# Patient Record
Sex: Male | Born: 1965 | Race: White | Hispanic: No | Marital: Single | Smoking: Current every day smoker
Health system: Southern US, Community
[De-identification: ages and names within clinical notes are randomized; demographics above are authoritative.]

## PROBLEM LIST (undated history)

## (undated) DIAGNOSIS — S43006A Unspecified dislocation of unspecified shoulder joint, initial encounter: Secondary | ICD-10-CM

---

## 1998-04-19 ENCOUNTER — Emergency Department (HOSPITAL_COMMUNITY): Admission: EM | Admit: 1998-04-19 | Discharge: 1998-04-19 | Payer: Self-pay | Admitting: Emergency Medicine

## 1998-04-19 ENCOUNTER — Encounter: Payer: Self-pay | Admitting: Emergency Medicine

## 1998-09-14 ENCOUNTER — Emergency Department (HOSPITAL_COMMUNITY): Admission: EM | Admit: 1998-09-14 | Discharge: 1998-09-14 | Payer: Self-pay | Admitting: Emergency Medicine

## 1998-12-29 ENCOUNTER — Encounter: Payer: Self-pay | Admitting: Emergency Medicine

## 1998-12-29 ENCOUNTER — Emergency Department (HOSPITAL_COMMUNITY): Admission: EM | Admit: 1998-12-29 | Discharge: 1998-12-29 | Payer: Self-pay | Admitting: Emergency Medicine

## 1998-12-30 ENCOUNTER — Ambulatory Visit (HOSPITAL_BASED_OUTPATIENT_CLINIC_OR_DEPARTMENT_OTHER): Admission: RE | Admit: 1998-12-30 | Discharge: 1998-12-30 | Payer: Self-pay | Admitting: Orthopedic Surgery

## 2000-09-19 ENCOUNTER — Emergency Department (HOSPITAL_COMMUNITY): Admission: EM | Admit: 2000-09-19 | Discharge: 2000-09-19 | Payer: Self-pay | Admitting: Emergency Medicine

## 2002-06-30 ENCOUNTER — Emergency Department (HOSPITAL_COMMUNITY): Admission: EM | Admit: 2002-06-30 | Discharge: 2002-06-30 | Payer: Self-pay | Admitting: Emergency Medicine

## 2008-12-14 ENCOUNTER — Emergency Department (HOSPITAL_COMMUNITY): Admission: EM | Admit: 2008-12-14 | Discharge: 2008-12-14 | Payer: Self-pay | Admitting: Emergency Medicine

## 2010-07-10 LAB — WOUND CULTURE

## 2010-07-10 LAB — DIFFERENTIAL
Basophils Absolute: 0.1 10*3/uL (ref 0.0–0.1)
Basophils Relative: 1 % (ref 0–1)
Eosinophils Absolute: 0.1 10*3/uL (ref 0.0–0.7)
Eosinophils Relative: 2 % (ref 0–5)
Lymphocytes Relative: 27 % (ref 12–46)
Lymphs Abs: 2.4 10*3/uL (ref 0.7–4.0)
Monocytes Absolute: 0.7 10*3/uL (ref 0.1–1.0)
Monocytes Relative: 8 % (ref 3–12)
Neutro Abs: 5.6 10*3/uL (ref 1.7–7.7)
Neutrophils Relative %: 63 % (ref 43–77)

## 2010-07-10 LAB — CBC
HCT: 45.5 % (ref 39.0–52.0)
Hemoglobin: 15.7 g/dL (ref 13.0–17.0)
MCHC: 34.5 g/dL (ref 30.0–36.0)
MCV: 87.6 fL (ref 78.0–100.0)
Platelets: 231 10*3/uL (ref 150–400)
RBC: 5.19 MIL/uL (ref 4.22–5.81)
RDW: 14.1 % (ref 11.5–15.5)
WBC: 8.9 10*3/uL (ref 4.0–10.5)

## 2010-11-08 ENCOUNTER — Emergency Department (HOSPITAL_COMMUNITY)
Admission: EM | Admit: 2010-11-08 | Discharge: 2010-11-08 | Disposition: A | Discharge status: Home / Self Care | Payer: Self-pay | Attending: Emergency Medicine | Admitting: Emergency Medicine

## 2010-11-08 DIAGNOSIS — H9209 Otalgia, unspecified ear: Secondary | ICD-10-CM | POA: Insufficient documentation

## 2010-11-08 DIAGNOSIS — H60399 Other infective otitis externa, unspecified ear: Secondary | ICD-10-CM | POA: Insufficient documentation

## 2012-11-18 ENCOUNTER — Emergency Department (HOSPITAL_COMMUNITY)
Admission: EM | Admit: 2012-11-18 | Discharge: 2012-11-18 | Disposition: A | Discharge status: Home / Self Care | Payer: Medicaid Other | Attending: Emergency Medicine | Admitting: Emergency Medicine

## 2012-11-18 ENCOUNTER — Encounter (HOSPITAL_COMMUNITY): Payer: Self-pay | Admitting: *Deleted

## 2012-11-18 ENCOUNTER — Emergency Department (HOSPITAL_COMMUNITY): Payer: Medicaid Other

## 2012-11-18 DIAGNOSIS — R1011 Right upper quadrant pain: Secondary | ICD-10-CM

## 2012-11-18 DIAGNOSIS — F17200 Nicotine dependence, unspecified, uncomplicated: Secondary | ICD-10-CM | POA: Insufficient documentation

## 2012-11-18 LAB — COMPREHENSIVE METABOLIC PANEL
BUN: 15 mg/dL (ref 6–23)
CO2: 24 mEq/L (ref 19–32)
Calcium: 9.3 mg/dL (ref 8.4–10.5)
Chloride: 100 mEq/L (ref 96–112)
Creatinine, Ser: 1.07 mg/dL (ref 0.50–1.35)
GFR calc non Af Amer: 82 mL/min — ABNORMAL LOW (ref >90)
Total Bilirubin: 0.2 mg/dL — ABNORMAL LOW (ref 0.3–1.2)

## 2012-11-18 LAB — POCT I-STAT TROPONIN I
Troponin i, poc: 0 ng/mL (ref 0.00–0.08)
Troponin i, poc: 0 ng/mL (ref 0.00–0.08)

## 2012-11-18 LAB — CBC WITH DIFFERENTIAL/PLATELET
Basophils Absolute: 0.1 10*3/uL (ref 0.0–0.1)
Lymphocytes Relative: 26 % (ref 12–46)
Neutro Abs: 7.1 10*3/uL (ref 1.7–7.7)
Neutrophils Relative %: 64 % (ref 43–77)
Platelets: 239 10*3/uL (ref 150–400)
RDW: 13.7 % (ref 11.5–15.5)
WBC: 11.1 10*3/uL — ABNORMAL HIGH (ref 4.0–10.5)

## 2012-11-18 LAB — LIPASE, BLOOD: Lipase: 45 U/L (ref 11–59)

## 2012-11-18 MED ORDER — OMEPRAZOLE 20 MG PO CPDR: 20.0000 mg | DELAYED_RELEASE_CAPSULE | Freq: Every day | ORAL | Status: DC

## 2012-11-18 MED ORDER — MORPHINE SULFATE 4 MG/ML IJ SOLN
4.0000 mg | Freq: Once | INTRAMUSCULAR | Status: AC
  Administered 2012-11-18: 4 mg via INTRAVENOUS
  Filled 2012-11-18: qty 1

## 2012-11-18 MED ORDER — ONDANSETRON HCL 4 MG/2ML IJ SOLN
4.0000 mg | Freq: Once | INTRAMUSCULAR | Status: AC
Start: 2012-11-18 — End: 2012-11-18
  Administered 2012-11-18: 4 mg via INTRAVENOUS
  Filled 2012-11-18: qty 2

## 2012-11-18 MED ORDER — OXYCODONE-ACETAMINOPHEN 5-325 MG PO TABS: 1.0000 | ORAL_TABLET | ORAL | Status: DC | PRN

## 2012-11-18 MED ORDER — SODIUM CHLORIDE 0.9 % IV BOLUS (SEPSIS)
1000.0000 mL | Freq: Once | INTRAVENOUS | Status: AC
  Administered 2012-11-18: 1000 mL via INTRAVENOUS

## 2012-11-18 NOTE — ED Notes (Signed)
Pt reports onset of RUQ pain last night, increases with movement, palpation, breathing. Denies any n/v/d. No acute distress noted at triage.

## 2012-11-18 NOTE — ED Provider Notes (Signed)
CSN: [id number]     Arrival date & time 11/18/12  1704 History     First MD Initiated Contact with Patient 11/18/12 1721     Chief Complaint  Patient presents with  . Abdominal Pain   (Consider location/radiation/quality/duration/timing/severity/associated sxs/prior Treatment) HPI Comments: Patient is a 47 year old male past medical history significant for tobacco use to emergency department for acute onset of right quadrant pain began last evening after consuming "several beers." Patient describes his pain as constant and sharp the radiation is worsened with deep inspiration and palpation. He rates his pain 10/10 without alleviating factors. Patient denies any chest pain, shortness of breath fevers, chills, nausea, vomiting, diarrhea. Patient has no prior abdominal surgical history.  Patient is a 47 y.o. male presenting with abdominal pain.  Abdominal Pain Associated symptoms: no chest pain, no chills, no constipation, no diarrhea, no fever, no nausea and no vomiting     History reviewed. No pertinent past medical history. History reviewed. No pertinent past surgical history. History reviewed. No pertinent family history. History  Substance Use Topics  . Smoking status: Current Every Day Smoker    Types: Cigarettes  . Smokeless tobacco: Not on file  . Alcohol Use: Yes     Comment: beer occ    Review of Systems  Constitutional: Negative for fever and chills.  HENT: Negative.   Eyes: Negative.   Respiratory: Negative.   Cardiovascular: Negative for chest pain.  Gastrointestinal: Positive for abdominal pain. Negative for nausea, vomiting, diarrhea and constipation.  Genitourinary: Negative.   Musculoskeletal: Negative.   Skin: Negative.   Neurological: Negative.     Allergies  Review of patient's allergies indicates no known allergies.  Home Medications   Current Outpatient Rx  Name  Route  Sig  Dispense  Refill  . ibuprofen (ADVIL,MOTRIN) 800 MG tablet   Oral   Take  800 mg by mouth every 8 (eight) hours as needed for pain.         Marland Kitchen omeprazole (PRILOSEC) 20 MG capsule   Oral   Take 1 capsule (20 mg total) by mouth daily.   30 capsule   0   . oxyCODONE-acetaminophen (PERCOCET/ROXICET) 5-325 MG per tablet   Oral   Take 1 tablet by mouth every 4 (four) hours as needed for pain.   15 tablet   0    BP 103/60  Pulse 72  Temp(Src) 98.3 F (36.8 C) (Oral)  Resp 19  SpO2 98% Physical Exam  Constitutional: He is oriented to person, place, and time. He appears well-developed and well-nourished. No distress.  HENT:  Head: Normocephalic and atraumatic.  Mouth/Throat: Oropharynx is clear and moist.  Eyes: Conjunctivae are normal.  Neck: Neck supple.  Cardiovascular: Normal rate, regular rhythm, normal heart sounds and intact distal pulses.   Pulmonary/Chest: Effort normal and breath sounds normal.  Abdominal: Soft. Bowel sounds are normal. There is tenderness in the right upper quadrant. There is positive Murphy's sign. There is no rigidity, no rebound and no guarding.  Neurological: He is alert and oriented to person, place, and time.  Skin: Skin is warm and dry. He is not diaphoretic.    ED Course   Procedures (including critical care time)   Date: 11/18/2012  Rate: 68  Rhythm: normal sinus rhythm  QRS Axis: normal  Intervals: normal  ST/T Wave abnormalities: normal  Conduction Disutrbances:none  Narrative Interpretation:   Old EKG Reviewed: none available   Labs Reviewed  COMPREHENSIVE METABOLIC PANEL - Abnormal; Notable for the  following:    Glucose, Bld 101 (*)    Total Bilirubin 0.2 (*)    GFR calc non Af Amer 82 (*)    All other components within normal limits  CBC WITH DIFFERENTIAL - Abnormal; Notable for the following:    WBC 11.1 (*)    All other components within normal limits  LIPASE, BLOOD  URINALYSIS, ROUTINE W REFLEX MICROSCOPIC  POCT I-STAT TROPONIN I  POCT I-STAT TROPONIN I   US Abdomen Complete  11/18/2012    *RADIOLOGY REPORT*  Abdominal ultrasound  History:  Abdominal pain  Comparison:  None  Findings:  Gallbladder is visualized in multiple projections.  The gallbladder wall is thickened.  There is no pericholecystic fluid. There are several opacities along the wall of gallbladder which do not move but do show ring-down type artifact.  This appearance is consistent with inflammatory change in the gallbladder wall such as cholesterolosis or adenomyomatosis.  There are no echogenic foci which move and shadow as is expected with gallstones.  There is no intrahepatic, common hepatic, or common bile duct dilatation.  Pancreas appears normal.  The liver has a a somewhat inhomogeneous echotexture pattern with overall increased echogenicity.  No focal liver lesions are identified.  Spleen is normal in size and homogeneous in echotexture. Kidneys bilaterally appear normal.  There is no ascites.  Aorta is nonaneurysmal.  Inferior vena cava appears normal.  Conclusion:  The appearance of the gallbladder suggests inflammatory process such as cholesterolosis or adenomyomatosis. No gallstones are seen, and there is no pericholecystic fluid. There is no biliary duct dilatation.  The liver has an inhomogeneous echotexture pattern.  The appearance of the liver is consistent with fatty change, possibly with superimposed parenchymal disease.  While no liver masses are identified, it must be cautioned that the sensitivity of ultrasound for focal mass lesions is diminished given this underlying echotexture pattern.  Study otherwise unremarkable.   Original Report Authenticated By: Bretta Bang, M.D.   1. RUQ abdominal pain     MDM  Patient presenting with right upper quadrant abdominal pain. Abdomen soft, nondistended no guarding, rigidity, distention. Murphy sign appreciated. Labs, EKG, and imaging have been reviewed. No concerning for acute cardiac cause of pain. Ultrasound showed no acute findings warranting hospitalization or  surgery consult this time. Pain managed in the ED. On reevaluation no acute abdomen. Advised patient to follow up with general surgeon as an outpatient. Also advised patient to be started on omeprazole. Patient is agreeable to plan. Discussed patient case with Dr. Fayrene Fearing who agrees with my plan. Patient stable at time of discharge.  Jeannetta Ellis, PA-C 11/18/12 2046

## 2012-11-21 NOTE — ED Provider Notes (Signed)
Medical screening examination/treatment/procedure(s) were performed by non-physician practitioner and as supervising physician I was immediately available for consultation/collaboration.   Shirle Provencal Joseph Hildegarde Dunaway, MD 11/21/12 0716 

## 2012-12-01 ENCOUNTER — Ambulatory Visit (INDEPENDENT_AMBULATORY_CARE_PROVIDER_SITE_OTHER): Payer: Medicaid Other | Admitting: Surgery

## 2012-12-01 ENCOUNTER — Encounter (INDEPENDENT_AMBULATORY_CARE_PROVIDER_SITE_OTHER): Payer: Self-pay | Admitting: Surgery

## 2012-12-01 VITALS — BP 132/80 | HR 72 | Temp 98.6°F | Resp 15 | Ht 68.0 in | Wt 211.6 lb

## 2012-12-01 DIAGNOSIS — R1013 Epigastric pain: Secondary | ICD-10-CM

## 2012-12-01 NOTE — Progress Notes (Signed)
Patient ID: Anthony Lewis, male   DOB: 1965/07/12, 47 y.o.   MRN: [id number]  Chief Complaint  Patient presents with  . New Evaluation    eval gb    HPI Anthony Lewis is a 47 y.o. male.  Patient sent at the request of Dr. Fayrene Fearing for history of epigastric and right upper quadrant pain. He was traveling back from [place] 3 weeks ago and developed pain in his epigastrium and along his right costal margin. He was seen in the emergency room an ultrasound showed no gallstones the gallbladder wall was felt to be thickened. He was sent home and seen by his primary care doctor. She's placed on omeprazole and this made his pain go away. He has had no problems since. Denies any feeding intolerance. He is able eat fatty food without any pain. He feels fine now. HPI  History reviewed. No pertinent past medical history.  History reviewed. No pertinent past surgical history.  History reviewed. No pertinent family history.  Social History History  Substance Use Topics  . Smoking status: Current Every Day Smoker -- 1.50 packs/day    Types: Cigarettes  . Smokeless tobacco: Never Used  . Alcohol Use: Yes     Comment: beer occ    No Known Allergies  Current Outpatient Prescriptions  Medication Sig Dispense Refill  . ibuprofen (ADVIL,MOTRIN) 800 MG tablet Take 800 mg by mouth every 8 (eight) hours as needed for pain.      Marland Kitchen omeprazole (PRILOSEC) 20 MG capsule Take 1 capsule (20 mg total) by mouth daily.  30 capsule  0  . oxyCODONE-acetaminophen (PERCOCET/ROXICET) 5-325 MG per tablet Take 1 tablet by mouth every 4 (four) hours as needed for pain.  15 tablet  0   No current facility-administered medications for this visit.    Review of Systems Review of Systems  Constitutional: Negative.   HENT: Negative.   Eyes: Negative.   Respiratory: Positive for cough.   Cardiovascular: Negative.   Gastrointestinal: Negative.   Endocrine: Negative.   Genitourinary: Negative.    Musculoskeletal: Negative.   Skin: Negative.   Allergic/Immunologic: Negative.   Neurological: Negative.   Hematological: Negative.   Psychiatric/Behavioral: Negative.     Blood pressure 132/80, pulse 72, temperature 98.6 F (37 C), temperature source Temporal, resp. rate 15, height 5\' 8"  (1.727 m), weight 211 lb 9.6 oz (95.981 kg).  Physical Exam Physical Exam  Constitutional: He is oriented to person, place, and time. He appears well-developed and well-nourished.  HENT:  Head: Normocephalic and atraumatic.  Eyes: EOM are normal. Pupils are equal, round, and reactive to light.  Neck: Normal range of motion. Neck supple.  Cardiovascular: Normal rate and regular rhythm.   Pulmonary/Chest: He has wheezes.  Abdominal: Soft. Bowel sounds are normal. He exhibits no distension. There is no tenderness. There is no rebound and no guarding.  Musculoskeletal: Normal range of motion.  Neurological: He is alert and oriented to person, place, and time.  Skin: Skin is warm and dry.  Psychiatric: He has a normal mood and affect. His behavior is normal. Judgment and thought content normal.    Data Reviewed Abdominal ultrasound  History: Abdominal pain  Comparison: None  Findings: Gallbladder is visualized in multiple projections. The  gallbladder wall is thickened. There is no pericholecystic fluid.  There are several opacities along the wall of gallbladder which do  not move but do show ring-down type artifact. This appearance is  consistent with inflammatory change in the gallbladder  wall such as  cholesterolosis or adenomyomatosis. There are no echogenic foci  which move and shadow as is expected with gallstones.  There is no intrahepatic, common hepatic, or common bile duct  dilatation. Pancreas appears normal.  The liver has a a somewhat inhomogeneous echotexture pattern with  overall increased echogenicity. No focal liver lesions are  identified.  Spleen is normal in size and  homogeneous in echotexture. Kidneys  bilaterally appear normal. There is no ascites. Aorta is  nonaneurysmal. Inferior vena cava appears normal.  Conclusion: The appearance of the gallbladder suggests  inflammatory process such as cholesterolosis or adenomyomatosis. No  gallstones are seen, and there is no pericholecystic fluid. There  is no biliary duct dilatation.  The liver has an inhomogeneous echotexture pattern. The appearance  of the liver is consistent with fatty change, possibly with  superimposed parenchymal disease. While no liver masses are  identified, it must be cautioned that the sensitivity of ultrasound  for focal mass lesions is diminished given this underlying  echotexture pattern.  Study otherwise unremarkable.  Original Report Authenticated By: Bretta Bang, M.D.         Assessment    Abdominal pain resolved with omeprazole  Thickened gallbladder wall without stones    Plan    Continue omeprazole. No indication cholecystectomy necessary. If symptoms worsen or become related to eating may need HIDA  Scan.  Followup as needed.       [name] 12/01/2012, 10:27 AM

## 2012-12-01 NOTE — Patient Instructions (Signed)
Return if symptoms worsen

## 2014-08-21 ENCOUNTER — Emergency Department (HOSPITAL_COMMUNITY)
Admission: EM | Admit: 2014-08-21 | Discharge: 2014-08-22 | Disposition: A | Discharge status: Home / Self Care | Payer: Self-pay | Attending: Emergency Medicine | Admitting: Emergency Medicine

## 2014-08-21 ENCOUNTER — Encounter (HOSPITAL_COMMUNITY): Payer: Self-pay | Admitting: Emergency Medicine

## 2014-08-21 ENCOUNTER — Emergency Department (HOSPITAL_COMMUNITY): Payer: Medicaid Other

## 2014-08-21 ENCOUNTER — Emergency Department (HOSPITAL_COMMUNITY): Payer: Self-pay

## 2014-08-21 DIAGNOSIS — W19XXXA Unspecified fall, initial encounter: Secondary | ICD-10-CM

## 2014-08-21 DIAGNOSIS — R52 Pain, unspecified: Secondary | ICD-10-CM

## 2014-08-21 DIAGNOSIS — Y939 Activity, unspecified: Secondary | ICD-10-CM | POA: Insufficient documentation

## 2014-08-21 DIAGNOSIS — Y999 Unspecified external cause status: Secondary | ICD-10-CM | POA: Insufficient documentation

## 2014-08-21 DIAGNOSIS — S43005A Unspecified dislocation of left shoulder joint, initial encounter: Secondary | ICD-10-CM | POA: Insufficient documentation

## 2014-08-21 DIAGNOSIS — W1789XA Other fall from one level to another, initial encounter: Secondary | ICD-10-CM | POA: Insufficient documentation

## 2014-08-21 DIAGNOSIS — S40812A Abrasion of left upper arm, initial encounter: Secondary | ICD-10-CM | POA: Insufficient documentation

## 2014-08-21 DIAGNOSIS — M21822 Other specified acquired deformities of left upper arm: Secondary | ICD-10-CM

## 2014-08-21 DIAGNOSIS — Z720 Tobacco use: Secondary | ICD-10-CM | POA: Insufficient documentation

## 2014-08-21 DIAGNOSIS — Y9289 Other specified places as the place of occurrence of the external cause: Secondary | ICD-10-CM | POA: Insufficient documentation

## 2014-08-21 DIAGNOSIS — S42292A Other displaced fracture of upper end of left humerus, initial encounter for closed fracture: Secondary | ICD-10-CM | POA: Insufficient documentation

## 2014-08-21 MED ORDER — HYDROMORPHONE HCL 1 MG/ML IJ SOLN
1.0000 mg | Freq: Once | INTRAMUSCULAR | Status: AC
  Administered 2014-08-21: 1 mg via INTRAVENOUS
  Filled 2014-08-21: qty 1

## 2014-08-21 MED ORDER — OXYCODONE-ACETAMINOPHEN 5-325 MG PO TABS: 1.0000 | ORAL_TABLET | ORAL | Status: DC | PRN

## 2014-08-21 MED ORDER — KETAMINE HCL 10 MG/ML IJ SOLN
0.5000 mg/kg | Freq: Once | INTRAMUSCULAR | Status: DC
  Filled 2014-08-21: qty 4.8

## 2014-08-21 MED ORDER — PROPOFOL 10 MG/ML IV BOLUS
INTRAVENOUS | Status: AC | PRN
  Administered 2014-08-21: 12.5 mg via INTRAVENOUS
  Administered 2014-08-21: 25 mg via INTRAVENOUS
  Administered 2014-08-21: 12.5 mg via INTRAVENOUS

## 2014-08-21 MED ORDER — KETAMINE HCL 10 MG/ML IJ SOLN: 0.5000 mg/kg | Freq: Once | INTRAMUSCULAR | Status: DC

## 2014-08-21 MED ORDER — KETAMINE HCL 10 MG/ML IJ SOLN
INTRAMUSCULAR | Status: AC | PRN
  Administered 2014-08-21: 12.5 mg via INTRAVENOUS
  Administered 2014-08-21: 25 mg via INTRAVENOUS
  Administered 2014-08-21: 12.5 mg via INTRAVENOUS

## 2014-08-21 MED ORDER — ONDANSETRON HCL 4 MG/2ML IJ SOLN
4.0000 mg | Freq: Once | INTRAMUSCULAR | Status: AC
Start: 2014-08-21 — End: 2014-08-21
  Administered 2014-08-21: 4 mg via INTRAVENOUS
  Filled 2014-08-21: qty 2

## 2014-08-21 MED ORDER — PROPOFOL 10 MG/ML IV BOLUS
0.5000 mg/kg | Freq: Once | INTRAVENOUS | Status: DC
  Filled 2014-08-21: qty 20

## 2014-08-21 NOTE — Sedation Documentation (Signed)
Ambu bag, airway cart, and code cart at bedside. O2 and 12 lead placed on patient. VSS. Consent obtained. EDP at bedside.

## 2014-08-21 NOTE — Discharge Instructions (Signed)
Shoulder Dislocation Your shoulder is made up of three bones: the collar bone (clavicle); the shoulder blade (scapula), which includes the socket (glenoid cavity); and the upper arm bone (humerus). Your shoulder joint is the place where these bones meet. Strong, fibrous tissues hold these bones together (ligaments). Muscles and strong, fibrous tissues that connect the muscles to these bones (tendons) allow your arm to move through this joint. The range of motion of your shoulder joint is more extensive than most of your other joints, and the glenoid cavity is very shallow. That is the reason that your shoulder joint is one of the most unstable joints in your body. It is far more prone to dislocation than your other joints. Shoulder dislocation is when your humerus is forced out of your shoulder joint. CAUSES Shoulder dislocation is caused by a forceful impact on your shoulder. This impact usually is from an injury, such as a sports injury or a fall. SYMPTOMS Symptoms of shoulder dislocation include:  Deformity of your shoulder.  Intense pain.  Inability to move your shoulder joint.  Numbness, weakness, or tingling around your shoulder joint (your neck or down your arm).  Bruising or swelling around your shoulder. DIAGNOSIS In order to diagnose a dislocated shoulder, your caregiver will perform a physical exam. Your caregiver also may have an X-ray exam done to see if you have any broken bones. Magnetic resonance imaging (MRI) is a procedure that sometimes is done to help your caregiver see any damage to the soft tissues around your shoulder, particularly your rotator cuff tendons. Additionally, your caregiver also may have electromyography done to measure the electrical discharges produced in your muscles if you have signs or symptoms of nerve damage. TREATMENT A shoulder dislocation is treated by placing the humerus back in the joint (reduction). Your caregiver does this either manually (closed  reduction), by moving your humerus back into the joint through manipulation, or through surgery (open reduction). When your humerus is back in place, severe pain should improve almost immediately. You also may need to have surgery if you have a weak shoulder joint or ligaments, and you have recurring shoulder dislocations, despite rehabilitation. In rare cases, surgery is necessary if your nerves or blood vessels are damaged during the dislocation. After your reduction, your arm will be placed in a shoulder immobilizer or sling to keep it from moving. Your caregiver will have you wear your shoulder immobilizer or sling for 3 days to 3 weeks, depending on how serious your dislocation is. When your shoulder immobilizer or sling is removed, your caregiver may prescribe physical therapy to help improve the range of motion in your shoulder joint. HOME CARE INSTRUCTIONS  The following measures can help to reduce pain and speed up the healing process:  Rest your injured joint. Do not move it. Avoid activities similar to the one that caused your injury.  Apply ice to your injured joint for the first day or two after your reduction or as directed by your caregiver. Applying ice helps to reduce inflammation and pain.  Put ice in a plastic bag.  Place a towel between your skin and the bag.  Leave the ice on for 15-20 minutes at a time, every 2 hours while you are awake.  Exercise your hand by squeezing a soft ball. This helps to eliminate stiffness and swelling in your hand and wrist.  Take over-the-counter or prescription medicine for pain or discomfort as told by your caregiver. SEEK IMMEDIATE MEDICAL CARE IF:   Your  shoulder immobilizer or sling becomes damaged.  Your pain becomes worse rather than better.  You lose feeling in your arm or hand, or they become white and cold. MAKE SURE YOU:   Understand these instructions.  Will watch your condition.  Will get help right away if you are not  doing well or get worse. Document Released: 12/15/2000 Document Revised: 08/06/2013 Document Reviewed: 01/10/2011 [facility] Patient Information 2015 [place], [place]. This information is not intended to replace advice given to you by your health care provider. Make sure you discuss any questions you have with your health care provider.  Abrasion An abrasion is a cut or scrape of the skin. Abrasions do not extend through all layers of the skin and most heal within 10 days. It is important to care for your abrasion properly to prevent infection. CAUSES  Most abrasions are caused by falling on, or gliding across, the ground or other surface. When your skin rubs on something, the outer and inner layer of skin rubs off, causing an abrasion. DIAGNOSIS  Your caregiver will be able to diagnose an abrasion during a physical exam.  TREATMENT  Your treatment depends on how large and deep the abrasion is. Generally, your abrasion will be cleaned with water and a mild soap to remove any dirt or debris. An antibiotic ointment may be put over the abrasion to prevent an infection. A bandage (dressing) may be wrapped around the abrasion to keep it from getting dirty.  You may need a tetanus shot if:  You cannot remember when you had your last tetanus shot.  You have never had a tetanus shot.  The injury broke your skin. If you get a tetanus shot, your arm may swell, get red, and feel warm to the touch. This is common and not a problem. If you need a tetanus shot and you choose not to have one, there is a rare chance of getting tetanus. Sickness from tetanus can be serious.  HOME CARE INSTRUCTIONS   If a dressing was applied, change it at least once a day or as directed by your caregiver. If the bandage sticks, soak it off with warm water.   Wash the area with water and a mild soap to remove all the ointment 2 times a day. Rinse off the soap and pat the area dry with a clean towel.   Reapply any ointment as  directed by your caregiver. This will help prevent infection and keep the bandage from sticking. Use gauze over the wound and under the dressing to help keep the bandage from sticking.   Change your dressing right away if it becomes wet or dirty.   Only take over-the-counter or prescription medicines for pain, discomfort, or fever as directed by your caregiver.   Follow up with your caregiver within 24-48 hours for a wound check, or as directed. If you were not given a wound-check appointment, look closely at your abrasion for redness, swelling, or pus. These are signs of infection. SEEK IMMEDIATE MEDICAL CARE IF:   You have increasing pain in the wound.   You have redness, swelling, or tenderness around the wound.   You have pus coming from the wound.   You have a fever or persistent symptoms for more than 2-3 days.  You have a fever and your symptoms suddenly get worse.  You have a bad smell coming from the wound or dressing.  MAKE SURE YOU:   Understand these instructions.  Will watch your condition.  Will  get help right away if you are not doing well or get worse. Document Released: 12/30/2004 Document Revised: 03/08/2012 Document Reviewed: 02/23/2011 [facility]Exit[facility] Patient Information 2015 [place], [place]. This information is not intended to replace advice given to you by your health care provider. Make sure you discuss any questions you have with your health care provider.

## 2014-08-21 NOTE — ED Notes (Signed)
Pt. fell from a tree ( approx. 15 ft. high ) and landed on his left side , no LOC / ambulatory , reports pain at left shoulder with deformity , left arm pain and left knee pain  .

## 2014-08-21 NOTE — ED Notes (Signed)
Pt remains monitored by blood pressure and pulse ox. Pts family remains at bedside.  

## 2014-08-21 NOTE — ED Notes (Signed)
Pharmacist removed left over ketamine in both vial and syringe.

## 2014-08-21 NOTE — ED Provider Notes (Signed)
CSN: [id number]     Arrival date & time 08/21/14  2026 History   First MD Initiated Contact with Patient 08/21/14 2144     Chief Complaint  Patient presents with  . Fall  . Shoulder Injury     (Consider location/radiation/quality/duration/timing/severity/associated sxs/prior Treatment) HPI    Raynald KempMichael C Kainz is a 49 y.o. male who fell down a 10 foot incline, injuring his left shoulder. He denies other injuries. He has not hurt this shoulder previously. He denies headache, neck pain, back pain, weakness or dizziness. He came here by private vehicle. There are no other known modifying factors.    History reviewed. No pertinent past medical history. History reviewed. No pertinent past surgical history. No family history on file. History  Substance Use Topics  . Smoking status: Current Every Day Smoker -- 1.50 packs/day    Types: Cigarettes  . Smokeless tobacco: Never Used  . Alcohol Use: Yes     Comment: beer occ    Review of Systems  All other systems reviewed and are negative.     Allergies  Review of patient's allergies indicates no known allergies.  Home Medications   Prior to Admission medications   Medication Sig Start Date End Date Taking? Authorizing Provider  ibuprofen (ADVIL,MOTRIN) 800 MG tablet Take 800 mg by mouth every 8 (eight) hours as needed for pain.   Yes Historical Provider, MD  omeprazole (PRILOSEC) 20 MG capsule Take 1 capsule (20 mg total) by mouth daily. Patient not taking: Reported on 08/21/2014 11/18/12   Francee PiccoloJennifer Piepenbrink, PA-C  oxyCODONE-acetaminophen (PERCOCET/ROXICET) 5-325 MG per tablet Take 1 tablet by mouth every 4 (four) hours as needed. 08/21/14   Mancel BaleElliott Abdifatah Colquhoun, MD   BP 124/85 mmHg  Pulse 83  Temp(Src) 98.7 F (37.1 C) (Oral)  Resp 20  Ht 5' 8.11" (1.73 m)  Wt 212 lb (96.163 kg)  BMI 32.13 kg/m2  SpO2 97% Physical Exam  Constitutional: He is oriented to person, place, and time. He appears well-developed and well-nourished.   HENT:  Head: Normocephalic and atraumatic.  Right Ear: External ear normal.  Left Ear: External ear normal.  Eyes: Conjunctivae and EOM are normal. Pupils are equal, round, and reactive to light.  Neck: Normal range of motion and phonation normal. Neck supple.  Cardiovascular: Normal rate, regular rhythm and normal heart sounds.   Pulmonary/Chest: Effort normal and breath sounds normal. He exhibits no bony tenderness.  Abdominal: Soft. There is no tenderness.  Musculoskeletal: Normal range of motion.  Left shoulder subacromial defect consistent with shoulder dislocation. Intact deltoid sensation and intact distal circulation and sensory in left hand  Neurological: He is alert and oriented to person, place, and time. No cranial nerve deficit or sensory deficit. He exhibits normal muscle tone. Coordination normal.  Skin: Skin is warm, dry and intact.  Psychiatric: He has a normal mood and affect. His behavior is normal. Judgment and thought content normal.  Nursing note and vitals reviewed.   ED Course  Procedures (including critical care time)  Medications  propofol (DIPRIVAN) 10 mg/mL bolus/IV push 48.1 mg (not administered)  ketamine (KETALAR) injection 48 mg (not administered)  ondansetron (ZOFRAN) injection 4 mg (4 mg Intravenous Given 08/21/14 2122)  HYDROmorphone (DILAUDID) injection 1 mg (1 mg Intravenous Given 08/21/14 2122)  propofol (DIPRIVAN) 10 mg/mL bolus/IV push ( Intravenous Stopped 08/21/14 2212)  ketamine (KETALAR) injection (12.5 mg Intravenous Given 08/21/14 2211)   Procedural sedation Performed by: Flint MelterWENTZ,Duke Weisensel L Consent: Verbal consent obtained. Risks and benefits: risks,  benefits and alternatives were discussed Required items: required blood products, implants, devices, and special equipment available Patient identity confirmed: arm band and provided demographic data Time out: Immediately prior to procedure a "time out" was called to verify the correct patient,  procedure, equipment, support staff and site/side marked as required.  Sedation type: moderate (conscious) sedation NPO time confirmed and considedered  Sedatives: PROPOFOL 62.5 mg and Ketamine 62.5 mg  Physician Time at Bedside: 20 minutes  Vitals: Vital signs were monitored during sedation. Cardiac Monitor, pulse oximeter Patient tolerance: Patient tolerated the procedure well with no immediate complications. Comments: Pt with uneventful recovered. Returned to pre-procedural sedation baseline  Reduction of dislocation Date/Time: 11:48 PM Performed by: Flint Melter Authorized by: Flint Melter Consent: Verbal consent obtained. Risks and benefits: risks, benefits and alternatives were discussed Consent given by: patient Required items: required blood products, implants, devices, and special equipment available Time out: Immediately prior to procedure a "time out" was called to verify the correct patient, procedure, equipment, support staff and site/side marked as required.  Patient sedated: Ketamine/Propofol  Vitals: Vital signs were monitored during sedation. Patient tolerance: Patient tolerated the procedure well with no immediate complications. Joint: left gleno-humeral Reduction technique: flexion and external rotation      Patient Vitals for the past 24 hrs:  BP Temp Temp src Pulse Resp SpO2 Height Weight  08/21/14 2330 124/85 mmHg - - 83 20 97 % - -  08/21/14 2300 136/84 mmHg - - 83 21 100 % - -  08/21/14 2255 123/78 mmHg - - 79 19 98 % - -  08/21/14 2250 135/91 mmHg - - 77 15 97 % - -  08/21/14 2245 133/88 mmHg - - 81 20 99 % - -  08/21/14 2240 133/96 mmHg - - 82 17 97 % - -  08/21/14 2235 137/95 mmHg - - 81 16 98 % - -  08/21/14 2230 125/99 mmHg - - 83 18 98 % - -  08/21/14 2225 108/85 mmHg - - 73 13 99 % - -  08/21/14 2220 102/66 mmHg - - 70 14 98 % - -  08/21/14 2215 (!) 157/115 mmHg - - 79 12 99 % - -  08/21/14 2210 (!) 151/102 mmHg - - 69 (!) 27 99 % -  -  08/21/14 2206 (!) 133/108 mmHg - - (!) 50 14 98 % - -  08/21/14 2136 133/97 mmHg - - 98 18 94 % 5' 8.11" (1.73 m) 212 lb (96.163 kg)  08/21/14 2045 103/67 mmHg 98.7 F (37.1 C) Oral (!) 56 16 99 % - -    11:48 PM Reevaluation with update and discussion. After initial assessment and treatment, an updated evaluation reveals he is comfortable, has no further complaints. Findings discussed with patient, all questions answered. Saamiya Jeppsen L    Labs Review Labs Reviewed - No data to display  Imaging Review Dg Shoulder Left  08/21/2014   CLINICAL DATA:  Status post 15 foot fall from a tree today. Left shoulder pain.  EXAM: LEFT SHOULDER - 2+ VIEW  COMPARISON:  None.  FINDINGS: The left shoulder is anteriorly dislocated with a large Hill-Sachs deformity identified. The acromioclavicular joint is intact. Imaged left lung and ribs appear normal.  IMPRESSION: Anterior left shoulder dislocation with associated Hill-Sachs lesion.   Electronically Signed   By: Drusilla Kanner M.D.   On: 08/21/2014 21:51   Dg Shoulder Left Port  08/21/2014   CLINICAL DATA:  Dislocation, postreduction.  EXAM: LEFT SHOULDER - 1  VIEW  COMPARISON:  Pre reduction views earlier this day.  FINDINGS: The anterior shoulder dislocation has been reduced. Hill-Sachs deformity is again noted. Acromioclavicular joint is congruent.  IMPRESSION: Anatomic alignment postreduction.  Hill-Sachs deformity noted.   Electronically Signed   By: Rubye OaksMelanie  Ehinger M.D.   On: 08/21/2014 23:34     EKG Interpretation None      MDM   Final diagnoses:  Shoulder dislocation, left, initial encounter  Hill Sachs deformity, left  Abrasion of arm, left, initial encounter    Left shoulder dislocation. Secondary Hill-Sachs deformity. Abrasion forearm without laceration.  Nursing Notes Reviewed/ Care Coordinated Applicable Imaging Reviewed Interpretation of Laboratory Data incorporated into ED treatment  The patient appears reasonably  screened and/or stabilized for discharge and I doubt any other medical condition or other [facility] requiring further screening, evaluation, or treatment in the ED at this time prior to discharge.  Plan: Home Medications- Percocet; Home Treatments- rest; return here if the recommended treatment, does not improve the symptoms; Recommended follow up- Ortho f/u asap   Mancel BaleElliott Bland Rudzinski, MD 08/21/14 2349

## 2014-08-26 ENCOUNTER — Emergency Department (HOSPITAL_COMMUNITY): Payer: Medicaid Other

## 2014-08-26 ENCOUNTER — Encounter (HOSPITAL_COMMUNITY): Payer: Self-pay | Admitting: Emergency Medicine

## 2014-08-26 ENCOUNTER — Emergency Department (HOSPITAL_COMMUNITY)
Admission: EM | Admit: 2014-08-26 | Discharge: 2014-08-26 | Disposition: A | Discharge status: Home / Self Care | Payer: Medicaid Other | Attending: Emergency Medicine | Admitting: Emergency Medicine

## 2014-08-26 ENCOUNTER — Emergency Department (HOSPITAL_COMMUNITY): Payer: Self-pay

## 2014-08-26 DIAGNOSIS — X58XXXA Exposure to other specified factors, initial encounter: Secondary | ICD-10-CM | POA: Insufficient documentation

## 2014-08-26 DIAGNOSIS — Y929 Unspecified place or not applicable: Secondary | ICD-10-CM | POA: Insufficient documentation

## 2014-08-26 DIAGNOSIS — S43005A Unspecified dislocation of left shoulder joint, initial encounter: Secondary | ICD-10-CM

## 2014-08-26 DIAGNOSIS — Y9389 Activity, other specified: Secondary | ICD-10-CM | POA: Insufficient documentation

## 2014-08-26 DIAGNOSIS — S43015A Anterior dislocation of left humerus, initial encounter: Secondary | ICD-10-CM | POA: Insufficient documentation

## 2014-08-26 DIAGNOSIS — Y998 Other external cause status: Secondary | ICD-10-CM | POA: Insufficient documentation

## 2014-08-26 DIAGNOSIS — Z792 Long term (current) use of antibiotics: Secondary | ICD-10-CM | POA: Insufficient documentation

## 2014-08-26 HISTORY — DX: Unspecified dislocation of unspecified shoulder joint, initial encounter: S43.006A

## 2014-08-26 MED ORDER — OXYCODONE-ACETAMINOPHEN 5-325 MG PO TABS: 2.0000 | ORAL_TABLET | ORAL | Status: DC | PRN

## 2014-08-26 MED ORDER — KETOROLAC TROMETHAMINE 60 MG/2ML IM SOLN
60.0000 mg | Freq: Once | INTRAMUSCULAR | Status: AC
  Administered 2014-08-26: 60 mg via INTRAMUSCULAR
  Filled 2014-08-26: qty 2

## 2014-08-26 NOTE — ED Notes (Signed)
Pt. reports left shoulder joint while trying to wear his shirt this evening , pt. stated that he felt  a pop while wearing his shirt, pain increases with movement .

## 2014-08-26 NOTE — Discharge Instructions (Signed)
Please follow the directions provided. Be sure to follow-up with orthopedic doctor listed for further evaluation of your shoulder. Be sure to wear your shoulder sling to immobilize her shoulder at the muscles strengthen. He may use the Percocet, 2 tablets every 4 hours as needed for pain. Don't hesitate to return for any new, worsening, or concerning symptoms.    SEEK IMMEDIATE MEDICAL CARE IF:  Your shoulder immobilizer or sling becomes damaged.  Your pain becomes worse rather than better.  You lose feeling in your arm or hand, or they become white and cold.

## 2014-08-26 NOTE — ED Notes (Signed)
EDP at bedside  

## 2014-08-26 NOTE — ED Provider Notes (Signed)
CSN: [id number]     Arrival date & time 08/26/14  2012 History   First MD Initiated Contact with Patient 08/26/14 2133     Chief Complaint  Patient presents with  . Dislocation   (Consider location/radiation/quality/duration/timing/severity/associated sxs/prior Treatment) HPI Anthony Lewis is a 49 year old male presenting with pain and deformity in left shoulder. He states he frequently has shoulder dislocations. Tonight he was putting a shirt over his head and he felt a pop and pain in his left shoulder. He immediately noticed the deformity in his shoulder and pain worsened with movement. He rates his pain as 8/10 with movement. He denies any alteration in sensation or change in color in his hand or fingertips.   Past Medical History  Diagnosis Date  . Shoulder dislocation    History reviewed. No pertinent past surgical history. No family history on file. History  Substance Use Topics  . Smoking status: Current Every Day Smoker -- 1.50 packs/day    Types: Cigarettes  . Smokeless tobacco: Never Used  . Alcohol Use: Yes     Comment: beer occ    Review of Systems  Constitutional: Negative for fever and chills.  HENT: Negative for sore throat.   Eyes: Negative for visual disturbance.  Respiratory: Negative for cough and shortness of breath.   Cardiovascular: Negative for chest pain and leg swelling.  Gastrointestinal: Negative for nausea, vomiting and diarrhea.  Genitourinary: Negative for dysuria.  Musculoskeletal: Positive for myalgias and arthralgias.  Skin: Negative for rash.  Neurological: Negative for weakness, numbness and headaches.      Allergies  Review of patient's allergies indicates no known allergies.  Home Medications   Prior to Admission medications   Medication Sig Start Date End Date Taking? Authorizing Provider  ibuprofen (ADVIL,MOTRIN) 800 MG tablet Take 800 mg by mouth every 8 (eight) hours as needed for pain.   Yes Historical Provider, MD   oxyCODONE-acetaminophen (PERCOCET/ROXICET) 5-325 MG per tablet Take 1 tablet by mouth every 4 (four) hours as needed. 08/21/14  Yes Mancel BaleElliott Wentz, MD  omeprazole (PRILOSEC) 20 MG capsule Take 1 capsule (20 mg total) by mouth daily. Patient not taking: Reported on 08/21/2014 11/18/12   Victorino DikeJennifer Piepenbrink, PA-C   BP 137/93 mmHg  Pulse 58  Resp 20  SpO2 99% Physical Exam  Constitutional: He appears well-developed and well-nourished. No distress.  HENT:  Head: Normocephalic and atraumatic.  Mouth/Throat: Oropharynx is clear and moist. No oropharyngeal exudate.  Eyes: Conjunctivae are normal.  Neck: Neck supple. No thyromegaly present.  Cardiovascular: Normal rate, regular rhythm and intact distal pulses.   Pulmonary/Chest: Effort normal and breath sounds normal. No respiratory distress. He has no wheezes. He has no rales. He exhibits no tenderness.  Abdominal: Soft. There is no tenderness.  Musculoskeletal: He exhibits tenderness.  S/P reduction: only mild tenderness, but no deformity, N/V intact  Lymphadenopathy:    He has no cervical adenopathy.  Neurological: He is alert.  Skin: Skin is warm and dry. No rash noted. He is not diaphoretic.  Psychiatric: He has a normal mood and affect.  Nursing note and vitals reviewed.   ED Course  Procedures (including critical care time) Labs Review Labs Reviewed - No data to display  Imaging Review Dg Shoulder Left  08/26/2014   CLINICAL DATA:  Post left shoulder reduction  EXAM: LEFT SHOULDER - 2+ VIEW  COMPARISON:  Left shoulder radiographs-earlier same day  FINDINGS: The scapular Y radiograph is degraded due obliquity.  There has been apparent relocation  of the previously noted anterior inferior glenohumeral joint dislocation. Focal concavity involving the posterior lateral aspect of the humeral head may represent a Hill-Sachs deformity. No definite bony Bankart deformity. Regional soft tissues appear normal. Limited visualization of the  adjacent thorax is normal.  IMPRESSION: 1. Apparent reduction of previously noted anterior inferior glenohumeral joint dislocation. 2. Focal concavity involving the posterior lateral aspect of the humeral head may represent a Hill-Sachs deformity. No definite bony Bankart deformity.   Electronically Signed   By: Simonne Come M.D.   On: 08/26/2014 22:13   Dg Shoulder Left  08/26/2014   CLINICAL DATA:  Patient dislocated left shoulder last week falling on a rock and left shoulder was reduced, patient heard a pop today trying to put on a shirt with left shoulder pain  EXAM: LEFT SHOULDER - 2+ VIEW  COMPARISON:  None.  FINDINGS: Humeral head is in an abnormally anterior and inferior position relative to the glenoid. No acute fractures identified.  IMPRESSION: Anterior dislocation of the humerus.   Electronically Signed   By: Esperanza Heir M.D.   On: 08/26/2014 21:29     EKG Interpretation None      MDM   Final diagnoses:  Shoulder dislocation, left, initial encounter   49 yo male presenting with dislocation of left proximal humeral head via x-ray. Reduced without complication by Dr. Radford Pax.  Pain managed in the ED.  Repeat x-ray confirms reduction. Pt is well-appearing, in no acute distress and vital signs reviewed and not concerning. He appears safe to be discharged.  Discharge include follow-up with their PCP and ortho.  Return precautions provided.  Pt aware of plan and in agreement.  Filed Vitals:   08/26/14 2145 08/26/14 2215 08/26/14 2217 08/26/14 2230  BP: 150/98 123/84 123/84 117/84  Pulse: 58  63 67  Temp:   97.9 F (36.6 C)   TempSrc:   Oral   Resp:   24   SpO2: 98%  95% 97%   Meds given in ED:  Medications  ketorolac (TORADOL) injection 60 mg (60 mg Intramuscular Given 08/26/14 2233)    Discharge Medication List as of 08/26/2014 10:30 PM         Harle Battiest, NP 08/27/14 1611  Nelva Nay, MD 08/30/14 [phone]

## 2016-01-29 ENCOUNTER — Encounter (HOSPITAL_COMMUNITY): Payer: Self-pay | Admitting: Emergency Medicine

## 2016-01-29 ENCOUNTER — Emergency Department (HOSPITAL_COMMUNITY)
Admission: EM | Admit: 2016-01-29 | Discharge: 2016-01-29 | Disposition: A | Discharge status: Home / Self Care | Payer: Medicaid Other | Attending: Emergency Medicine | Admitting: Emergency Medicine

## 2016-01-29 DIAGNOSIS — Y9389 Activity, other specified: Secondary | ICD-10-CM | POA: Insufficient documentation

## 2016-01-29 DIAGNOSIS — S61210A Laceration without foreign body of right index finger without damage to nail, initial encounter: Secondary | ICD-10-CM

## 2016-01-29 DIAGNOSIS — W268XXA Contact with other sharp object(s), not elsewhere classified, initial encounter: Secondary | ICD-10-CM | POA: Insufficient documentation

## 2016-01-29 DIAGNOSIS — F17210 Nicotine dependence, cigarettes, uncomplicated: Secondary | ICD-10-CM | POA: Insufficient documentation

## 2016-01-29 DIAGNOSIS — Y990 Civilian activity done for income or pay: Secondary | ICD-10-CM | POA: Insufficient documentation

## 2016-01-29 DIAGNOSIS — Z79899 Other long term (current) drug therapy: Secondary | ICD-10-CM | POA: Insufficient documentation

## 2016-01-29 DIAGNOSIS — Y92009 Unspecified place in unspecified non-institutional (private) residence as the place of occurrence of the external cause: Secondary | ICD-10-CM | POA: Insufficient documentation

## 2016-01-29 MED ORDER — LIDOCAINE HCL 1 % IJ SOLN
INTRAMUSCULAR | Status: AC
  Administered 2016-01-29: 20 mL
  Filled 2016-01-29: qty 20

## 2016-01-29 MED ORDER — LIDOCAINE HCL (PF) 1 % IJ SOLN: 10.0000 mL | Freq: Once | INTRAMUSCULAR | Status: DC

## 2016-01-29 MED ORDER — CEPHALEXIN 500 MG PO CAPS: 500.0000 mg | ORAL_CAPSULE | Freq: Two times a day (BID) | ORAL | 0 refills | Status: DC

## 2016-01-29 NOTE — Discharge Instructions (Signed)
Read the information below.  Use the prescribed medication as directed.  Please discuss all new medications with your pharmacist.  You may return to the Emergency Department at any time for worsening condition or any new symptoms that concern you.     If you develop redness, swelling, pus draining from the wound, or fevers greater than 100.4, return to the ER immediately for a recheck.   °

## 2016-01-29 NOTE — ED Triage Notes (Addendum)
Finger soaked in warm soapy water for 15 minutes. Bleeding controlled

## 2016-01-29 NOTE — ED Triage Notes (Signed)
Per pt, states he cut right pointer finger on a box cutter

## 2016-01-29 NOTE — ED Provider Notes (Signed)
[facility]-EMERGENCY DEPT Provider Note   CSN: [id number] Arrival date & time: 01/29/16  1230  By signing my name below, I, Soijett Blue, attest that this documentation has been prepared under the direction and in the presence of Trixie Dredge, PA-C Electronically Signed: Soijett Blue, ED Scribe. 01/29/16. 2:20 PM.   History   Chief Complaint Chief Complaint  Patient presents with  . Extremity Laceration    HPI Anthony Lewis is a 50 y.o. male who presents to the Emergency Department complaining of right index finger laceration onset PTA. Pt notes that he cut his right index finger on a box cutter while doing work at home. Pt reports that her tetanus is UTD at this time. Pt reports that he is right hand dominant. He states that he has tried applying pressure without medications for the relief of his symptoms. He denies color change, joint swelling, numbness, tingling, and any other symptoms.    The history is provided by the patient. No language interpreter was used.    Past Medical History:  Diagnosis Date  . Shoulder dislocation     There are no active problems to display for this patient.   History reviewed. No pertinent surgical history.     Home Medications    Prior to Admission medications   Medication Sig Start Date End Date Taking? Authorizing Provider  cephALEXin (KEFLEX) 500 MG capsule Take 1 capsule (500 mg total) by mouth 2 (two) times daily. 01/29/16   Trixie Dredge, PA-C  ibuprofen (ADVIL,MOTRIN) 800 MG tablet Take 800 mg by mouth every 8 (eight) hours as needed for pain.    Historical Provider, MD  omeprazole (PRILOSEC) 20 MG capsule Take 1 capsule (20 mg total) by mouth daily. Patient not taking: Reported on 08/21/2014 11/18/12   Francee Piccolo, PA-C  oxyCODONE-acetaminophen (PERCOCET/ROXICET) 5-325 MG per tablet Take 2 tablets by mouth every 4 (four) hours as needed. 08/26/14   Harle Battiest, NP    Family History No family history on file.  Social  History Social History  Substance Use Topics  . Smoking status: Current Every Day Smoker    Packs/day: 1.50    Types: Cigarettes  . Smokeless tobacco: Never Used  . Alcohol use Yes     Comment: beer occ     Allergies   Review of patient's allergies indicates no known allergies.   Review of Systems Review of Systems  Constitutional: Negative for fever.  Musculoskeletal: Negative for arthralgias and joint swelling.  Skin: Positive for wound (laceration to right index finger). Negative for color change.  Allergic/Immunologic: Negative for immunocompromised state.  Neurological: Negative for weakness and numbness.       No tingling  Hematological: Does not bruise/bleed easily.  Psychiatric/Behavioral: Positive for self-injury (accidental).    Physical Exam Updated Vital Signs BP 125/78 (BP Location: Left Arm)   Pulse 80   Temp 97.5 F (36.4 C) (Oral)   Resp 18   SpO2 99%   Physical Exam  Constitutional: He appears well-developed and well-nourished. No distress.  HENT:  Head: Normocephalic and atraumatic.  Neck: Neck supple.  Pulmonary/Chest: Effort normal.  Musculoskeletal:       Right hand: He exhibits laceration. He exhibits normal range of motion and no tenderness. Normal sensation noted. Normal strength noted.  4 cm laceration across the palmar aspect of right index finger. Hemostatic. Full active range of motion of all digits, strength 5/5, sensation intact, capillary refill < 2 seconds.   Neurological: He is alert.  Skin: He  is not diaphoretic.  Nursing note and vitals reviewed.   ED Treatments / Results  DIAGNOSTIC STUDIES: Oxygen Saturation is 98% on RA, nl by my interpretation.    COORDINATION OF CARE: 2:06 PM Discussed treatment plan with pt at bedside which includes laceration repair and abx Rx and pt agreed to plan.  Procedures .Marland Kitchen.Laceration Repair Date/Time: 01/29/2016 2:17 PM Performed by: Trixie DredgeWEST, Davonda Ausley Authorized by: Trixie DredgeWEST, Altheia Shafran   Consent:     Consent obtained:  Verbal   Consent given by:  Patient   Risks discussed:  Infection, pain and need for additional repair Anesthesia (see MAR for exact dosages):    Anesthesia method:  Nerve block   Block location:  Base of right index finger   Block needle gauge:  25 G   Block anesthetic:  Lidocaine 1% w/o epi (7 cc used)   Block injection procedure:  Anatomic landmarks identified and anatomic landmarks palpated   Block outcome:  Anesthesia achieved Laceration details:    Location:  Finger   Finger location:  R index finger   Length (cm):  4 Repair type:    Repair type:  Simple Pre-procedure details:    Preparation:  Patient was prepped and draped in usual sterile fashion Exploration:    Hemostasis achieved with:  Direct pressure   Wound exploration: wound explored through full range of motion and entire depth of wound probed and visualized     Wound extent: no foreign bodies/material noted     Contaminated: no   Treatment:    Area cleansed with:  Betadine   Amount of cleaning:  Standard   Irrigation solution:  Sterile saline   Irrigation method:  Pressure wash   Visualized foreign bodies/material removed: no   Skin repair:    Repair method:  Sutures   Suture size:  5-0   Wound skin closure material used: vicryl rapide.   Suture technique:  Simple interrupted   Number of sutures:  8 Approximation:    Approximation:  Close   Vermilion border: well-aligned   Post-procedure details:    Dressing:  Adhesive bandage and sterile dressing   Patient tolerance of procedure:  Tolerated well, no immediate complications       (including critical care time)  Medications Ordered in ED Medications  lidocaine (PF) (XYLOCAINE) 1 % injection 10 mL (not administered)  lidocaine (XYLOCAINE) 1 % (with pres) injection (20 mLs  Given 01/29/16 1456)     Initial Impression / Assessment and Plan / ED Course  I have reviewed the triage vital signs and the nursing notes.   Clinical  Course    Accidental laceration to palmar aspect dominant hand, index finger. Tetanus UTD. Laceration occurred < 12 hours prior to repair. Discussed laceration care with pt and answered questions. Pt to f-u for wound check should there be signs of dehiscence or infection. Pt is hemodynamically stable with no complaints prior to dc.  Discussed result, findings, treatment, and follow up  with patient.  Pt given return precautions.  Pt verbalizes understanding and agrees with plan.      Final Clinical Impressions(s) / ED Diagnoses   Final diagnoses:  Laceration of right index finger without foreign body without damage to nail, initial encounter    New Prescriptions Discharge Medication List as of 01/29/2016  2:49 PM    START taking these medications   Details  cephALEXin (KEFLEX) 500 MG capsule Take 1 capsule (500 mg total) by mouth 2 (two) times daily., Starting Thu 01/29/2016, Print  I personally performed the services described in this documentation, which was scribed in my presence. The recorded information has been reviewed and is accurate.     Trixie Dredge, PA-C 01/29/16 1604    Rolland Porter, MD 02/05/16 [phone]

## 2017-07-02 ENCOUNTER — Encounter (HOSPITAL_COMMUNITY): Payer: Self-pay | Admitting: Emergency Medicine

## 2017-07-02 ENCOUNTER — Emergency Department (HOSPITAL_COMMUNITY)
Admission: EM | Admit: 2017-07-02 | Discharge: 2017-07-02 | Disposition: A | Discharge status: Home / Self Care | Payer: Self-pay | Attending: Emergency Medicine | Admitting: Emergency Medicine

## 2017-07-02 ENCOUNTER — Emergency Department (HOSPITAL_COMMUNITY): Payer: Self-pay

## 2017-07-02 ENCOUNTER — Other Ambulatory Visit: Payer: Self-pay

## 2017-07-02 DIAGNOSIS — K529 Noninfective gastroenteritis and colitis, unspecified: Secondary | ICD-10-CM | POA: Insufficient documentation

## 2017-07-02 DIAGNOSIS — F17210 Nicotine dependence, cigarettes, uncomplicated: Secondary | ICD-10-CM | POA: Insufficient documentation

## 2017-07-02 DIAGNOSIS — R109 Unspecified abdominal pain: Secondary | ICD-10-CM

## 2017-07-02 DIAGNOSIS — K6389 Other specified diseases of intestine: Secondary | ICD-10-CM

## 2017-07-02 DIAGNOSIS — Z79899 Other long term (current) drug therapy: Secondary | ICD-10-CM | POA: Insufficient documentation

## 2017-07-02 LAB — COMPREHENSIVE METABOLIC PANEL
ALT: 26 U/L (ref 17–63)
ANION GAP: 10 (ref 5–15)
AST: 23 U/L (ref 15–41)
Albumin: 3.6 g/dL (ref 3.5–5.0)
Alkaline Phosphatase: 103 U/L (ref 38–126)
BUN: 13 mg/dL (ref 6–20)
CHLORIDE: 106 mmol/L (ref 101–111)
CO2: 20 mmol/L — AB (ref 22–32)
CREATININE: 1.03 mg/dL (ref 0.61–1.24)
Calcium: 8.8 mg/dL — ABNORMAL LOW (ref 8.9–10.3)
GFR calc non Af Amer: >60 mL/min (ref >60)
Glucose, Bld: 123 mg/dL — ABNORMAL HIGH (ref 65–99)
POTASSIUM: 3.7 mmol/L (ref 3.5–5.1)
SODIUM: 136 mmol/L (ref 135–145)
Total Bilirubin: 0.5 mg/dL (ref 0.3–1.2)
Total Protein: 6.5 g/dL (ref 6.5–8.1)

## 2017-07-02 LAB — URINALYSIS, ROUTINE W REFLEX MICROSCOPIC
Bilirubin Urine: NEGATIVE
GLUCOSE, UA: NEGATIVE mg/dL
HGB URINE DIPSTICK: NEGATIVE
Ketones, ur: NEGATIVE mg/dL
LEUKOCYTES UA: NEGATIVE
Nitrite: NEGATIVE
Protein, ur: NEGATIVE mg/dL
Specific Gravity, Urine: 1.024 (ref 1.005–1.030)
pH: 5 (ref 5.0–8.0)

## 2017-07-02 LAB — CBC
HCT: 46.8 % (ref 39.0–52.0)
HEMOGLOBIN: 15.7 g/dL (ref 13.0–17.0)
MCH: 29.8 pg (ref 26.0–34.0)
MCHC: 33.5 g/dL (ref 30.0–36.0)
MCV: 88.8 fL (ref 78.0–100.0)
Platelets: 236 10*3/uL (ref 150–400)
RBC: 5.27 MIL/uL (ref 4.22–5.81)
RDW: 13.8 % (ref 11.5–15.5)
WBC: 8.9 10*3/uL (ref 4.0–10.5)

## 2017-07-02 LAB — LIPASE, BLOOD: Lipase: 42 U/L (ref 11–51)

## 2017-07-02 MED ORDER — ONDANSETRON HCL 4 MG/2ML IJ SOLN
4.0000 mg | Freq: Once | INTRAMUSCULAR | Status: AC
  Administered 2017-07-02: 4 mg via INTRAVENOUS
  Filled 2017-07-02: qty 2

## 2017-07-02 MED ORDER — KETOROLAC TROMETHAMINE 15 MG/ML IJ SOLN
15.0000 mg | Freq: Once | INTRAMUSCULAR | Status: AC
  Administered 2017-07-02: 15 mg via INTRAVENOUS
  Filled 2017-07-02: qty 1

## 2017-07-02 MED ORDER — IBUPROFEN 600 MG PO TABS: 600.0000 mg | ORAL_TABLET | Freq: Three times a day (TID) | ORAL | 0 refills | Status: DC | PRN

## 2017-07-02 MED ORDER — HYDROMORPHONE HCL 1 MG/ML IJ SOLN
1.0000 mg | Freq: Once | INTRAMUSCULAR | Status: AC
  Administered 2017-07-02: 1 mg via INTRAVENOUS
  Filled 2017-07-02: qty 1

## 2017-07-02 MED ORDER — SODIUM CHLORIDE 0.9 % IV BOLUS
1000.0000 mL | Freq: Once | INTRAVENOUS | Status: AC
  Administered 2017-07-02: 1000 mL via INTRAVENOUS

## 2017-07-02 MED ORDER — IOPAMIDOL (ISOVUE-300) INJECTION 61%
INTRAVENOUS | Status: AC
  Administered 2017-07-02: 100 mL
  Filled 2017-07-02: qty 100

## 2017-07-02 MED ORDER — ONDANSETRON 4 MG PO TBDP: 4.0000 mg | ORAL_TABLET | Freq: Three times a day (TID) | ORAL | 0 refills | Status: DC | PRN

## 2017-07-02 MED ORDER — OXYCODONE-ACETAMINOPHEN 5-325 MG PO TABS
2.0000 | ORAL_TABLET | Freq: Once | ORAL | Status: AC
  Administered 2017-07-02: 2 via ORAL
  Filled 2017-07-02: qty 2

## 2017-07-02 MED ORDER — DOCUSATE SODIUM 250 MG PO CAPS: 250.0000 mg | ORAL_CAPSULE | Freq: Every day | ORAL | 0 refills | Status: DC

## 2017-07-02 MED ORDER — OXYCODONE-ACETAMINOPHEN 5-325 MG PO TABS
1.0000 | ORAL_TABLET | ORAL | Status: DC | PRN
  Administered 2017-07-02: 1 via ORAL
  Filled 2017-07-02: qty 1

## 2017-07-02 MED ORDER — OXYCODONE-ACETAMINOPHEN 5-325 MG PO TABS: 1.0000 | ORAL_TABLET | ORAL | 0 refills | Status: DC | PRN

## 2017-07-02 NOTE — ED Notes (Signed)
Pt informed this RN at nurse first that he would be outside smoking.

## 2017-07-02 NOTE — ED Notes (Signed)
No response when called for vitals re-check at 16:16.

## 2017-07-02 NOTE — ED Triage Notes (Signed)
Pt to ER for evaluation of 4 days of RLQ abdominal pain. Denies nausea, vomiting, diarrhea. Pt in NAD. A/o x4. RUQ tender on palpation. States pain with deep breathing.

## 2017-07-02 NOTE — ED Provider Notes (Signed)
Patient placed in Quick Look pathway, seen and evaluated   Chief Complaint: Abdominal pain.   HPI:   Patient presents with RUQ abdominal pain x 4 days. Pain is constant. Worse with deep breath.  ROS:  Positive for abdominal pain. Negative for N/V/D, dysuria, or chest pain.   Physical Exam:   Gen: No distress  Neuro: Awake and Alert  Skin: Warm    Focused Exam: Abdomen: RUQ tenderness to palpation with positive Murphys Sign. No tenderness over McBurnyes point. No CVA tenderness.   RUQ US ordered.  Initiation of care has begun. The patient has been counseled on the process, plan, and necessity for staying for the completion/evaluation, and the remainder of the medical screening examination   Cherly Andersonetrucelli, Rickiya Picariello R, PA-C 07/02/17 1338    Wynetta FinesMessick, Peter C, MD 07/02/17 2212

## 2017-07-02 NOTE — ED Provider Notes (Signed)
[facility] EMERGENCY DEPARTMENT Provider Note   CSN: [id number] Arrival date & time: 07/02/17  1322     History   Chief Complaint Chief Complaint  Patient presents with  . Abdominal Pain    HPI Anthony Lewis is a 52 y.o. male.  HPI   52 year old otherwise healthy male here with severe right-sided abdominal pain.  The pain began gradually 3-4 days ago.  Since then, has had persistent, worsening, right-sided abdominal pain.  He describes it as an aching, throbbing, severe pain that is sharp whenever he moves or palpates his abdomen.  He also has some pain with deep inspiration.  No cough or shortness of breath.  Denies any preceding factors.  The symptoms began while he was on his lawnmower.  Denies any chest pain.  No fevers chills.  No urinary symptoms.  No change in bowel habits or diarrhea or constipation.  No other medical complaints.  Pain is relieved with staying still only.  No other specific alleviating factors.  Past Medical History:  Diagnosis Date  . Shoulder dislocation     There are no active problems to display for this patient.   History reviewed. No pertinent surgical history.      Home Medications    Prior to Admission medications   Medication Sig Start Date End Date Taking? Authorizing Provider  cephALEXin (KEFLEX) 500 MG capsule Take 1 capsule (500 mg total) by mouth 2 (two) times daily. 01/29/16   Trixie Dredge, PA-C  docusate sodium (COLACE) 250 MG capsule Take 1 capsule (250 mg total) by mouth daily. 07/02/17   Shaune Pollack, MD  ibuprofen (ADVIL,MOTRIN) 600 MG tablet Take 1 tablet (600 mg total) by mouth every 8 (eight) hours as needed for moderate pain. 07/02/17   Shaune Pollack, MD  omeprazole (PRILOSEC) 20 MG capsule Take 1 capsule (20 mg total) by mouth daily. Patient not taking: Reported on 08/21/2014 11/18/12   Piepenbrink, Victorino Dike, PA-C  ondansetron (ZOFRAN ODT) 4 MG disintegrating tablet Take 1 tablet (4 mg total) by  mouth every 8 (eight) hours as needed for nausea or vomiting. 07/02/17   Shaune Pollack, MD  oxyCODONE-acetaminophen (PERCOCET/ROXICET) 5-325 MG tablet Take 1-2 tablets by mouth every 4 (four) hours as needed for moderate pain or severe pain. 07/02/17   Shaune Pollack, MD    Family History History reviewed. No pertinent family history.  Social History Social History   Tobacco Use  . Smoking status: Current Every Day Smoker    Packs/day: 1.50    Types: Cigarettes  . Smokeless tobacco: Never Used  Substance Use Topics  . Alcohol use: Yes    Comment: beer occ  . Drug use: No     Allergies   Patient has no known allergies.   Review of Systems Review of Systems  Constitutional: Positive for fatigue. Negative for chills and fever.  HENT: Negative for congestion and rhinorrhea.   Eyes: Negative for visual disturbance.  Respiratory: Negative for cough, shortness of breath and wheezing.   Cardiovascular: Negative for chest pain and leg swelling.  Gastrointestinal: Positive for abdominal pain. Negative for diarrhea, nausea and vomiting.  Genitourinary: Negative for dysuria and flank pain.  Musculoskeletal: Negative for neck pain and neck stiffness.  Skin: Negative for rash and wound.  Allergic/Immunologic: Negative for immunocompromised state.  Neurological: Negative for syncope, weakness and headaches.  All other systems reviewed and are negative.    Physical Exam Updated Vital Signs BP 113/89 (BP Location: Right Arm)   Pulse  77   Temp 97.8 F (36.6 C) (Oral)   Resp 20   SpO2 96%   Physical Exam  Constitutional: He is oriented to person, place, and time. He appears well-developed and well-nourished. No distress.  HENT:  Head: Normocephalic and atraumatic.  Eyes: Conjunctivae are normal.  Neck: Neck supple.  Cardiovascular: Normal rate, regular rhythm and normal heart sounds. Exam reveals no friction rub.  No murmur heard. Pulmonary/Chest: Effort normal and breath  sounds normal. No respiratory distress. He has no wheezes. He has no rales.  Abdominal: Soft. Normal appearance. He exhibits no distension. There is tenderness in the right upper quadrant and right lower quadrant. There is guarding.  Musculoskeletal: He exhibits no edema.  Neurological: He is alert and oriented to person, place, and time. He exhibits normal muscle tone.  Skin: Skin is warm. Capillary refill takes less than 2 seconds.  Psychiatric: He has a normal mood and affect.  Nursing note and vitals reviewed.    ED Treatments / Results  Labs (all labs ordered are listed, but only abnormal results are displayed) Labs Reviewed  COMPREHENSIVE METABOLIC PANEL - Abnormal; Notable for the following components:      Result Value   CO2 20 (*)    Glucose, Bld 123 (*)    Calcium 8.8 (*)    All other components within normal limits  LIPASE, BLOOD  CBC  URINALYSIS, ROUTINE W REFLEX MICROSCOPIC    EKG None  Radiology Ct Abdomen Pelvis W Contrast  Result Date: 07/02/2017 CLINICAL DATA:  RIGHT abdominal pain for 4 days. Nausea and vomiting. EXAM: CT ABDOMEN AND PELVIS WITH CONTRAST TECHNIQUE: Multidetector CT imaging of the abdomen and pelvis was performed using the standard protocol following bolus administration of intravenous contrast. CONTRAST:  100mL ISOVUE-300 IOPAMIDOL (ISOVUE-300) INJECTION 61% COMPARISON:  Abdominal ultrasound July 02, 2017 FINDINGS: LOWER CHEST: Dependent atelectasis. 3 mm subpleural RIGHT lower lobe pulmonary nodule, no routine indicated follow-up by consensus criteria. Included heart size is normal. No pericardial effusion. HEPATOBILIARY: Liver and gallbladder are normal. PANCREAS: Normal. SPLEEN: Normal. ADRENALS/URINARY TRACT: Kidneys are orthotopic, demonstrating symmetric enhancement. No nephrolithiasis, hydronephrosis or solid renal masses. The unopacified ureters are normal in course and caliber. Delayed imaging through the kidneys demonstrates symmetric  prompt contrast excretion within the proximal urinary collecting system. Urinary bladder is adequately distended and unremarkable. Normal adrenal glands. STOMACH/BOWEL: The stomach, small and large bowel are normal in course and caliber without inflammatory changes, sensitivity decreased without oral contrast. Moderate to severe sigmoid and descending colonic diverticulosis. A few additional scattered colonic diverticula present. Normal appendix. VASCULAR/LYMPHATIC: Aortoiliac vessels are normal in course and caliber. Mild intimal thickening calcific atherosclerosis. No lymphadenopathy by CT size criteria. REPRODUCTIVE: Normal. OTHER: Focal inflammation/fat necrosis RIGHT abdomen displacing bowel. No intraperitoneal free fluid or free air. Small bilateral fat containing inguinal hernias. Small fat containing umbilical hernia. MUSCULOSKELETAL: Nonacute.  Mild sacroiliac osteoarthrosis. IMPRESSION: 1. RIGHT abdominal omental infarct versus epiploic appendagitis. 2. Colonic diverticulosis without acute diverticulitis. Normal appendix. Aortic Atherosclerosis (ICD10-I70.0). Electronically Signed   By: Awilda Metroourtnay  Bloomer M.D.   On: 07/02/2017 22:08   [place] Abdomen Limited Ruq  Result Date: 07/02/2017 CLINICAL DATA:  Right upper quadrant pain since Tuesday. EXAM: ULTRASOUND ABDOMEN LIMITED RIGHT UPPER QUADRANT COMPARISON:  None. FINDINGS: Gallbladder: Comet tail artifact from the gallbladder fundus is suspected on image 16. No stone, wall thickening, pericholecystic fluid. Sonographic Murphy's sign was not elicited. Common bile duct: Diameter: Normal, 5 mm. Liver: Heterogeneously increased in echogenicity. Portal vein is  patent on color Doppler imaging with normal direction of blood flow towards the liver. IMPRESSION: 1.  No acute process or explanation for right upper quadrant pain. 2. 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 3. Heterogeneous hepatic steatosis. Electronically Signed   By: Jeronimo Greaves M.D.   On: 07/02/2017  16:43    Procedures Procedures (including critical care time)  Medications Ordered in ED Medications  oxyCODONE-acetaminophen (PERCOCET/ROXICET) 5-325 MG per tablet 1 tablet (1 tablet Oral Given 07/02/17 1331)  HYDROmorphone (DILAUDID) injection 1 mg (1 mg Intravenous Given 07/02/17 2125)  ondansetron (ZOFRAN) injection 4 mg (4 mg Intravenous Given 07/02/17 2125)  sodium chloride 0.9 % bolus 1,000 mL (0 mLs Intravenous Stopped 07/02/17 2226)  ketorolac (TORADOL) 15 MG/ML injection 15 mg (15 mg Intravenous Given 07/02/17 2125)  iopamidol (ISOVUE-300) 61 % injection (100 mLs  Contrast Given 07/02/17 2132)  oxyCODONE-acetaminophen (PERCOCET/ROXICET) 5-325 MG per tablet 2 tablet (2 tablets Oral Given 07/02/17 2302)     Initial Impression / Assessment and Plan / ED Course  I have reviewed the triage vital signs and the nursing notes.  Pertinent labs & imaging results that were available during my care of the patient were reviewed by me and considered in my medical decision making (see chart for details).     52 year old male here with right-sided abdominal pain.  Lab work reassuring.  On exam, however, he has significant right-sided tenderness.  Ultrasound obtained in triage is negative.  CT scan subsequently obtained and is consistent with epiploic appendage otitis versus omental infarct.  I discussed the findings with Dr. Dwain Sarna, who reviewed the imaging.  He does not feel that the patient needs admission or surgical intervention for this.  Patient's pain is significantly improved following analgesia here.  Per discussion with Dr. Dwain Sarna, will treat supportively with pain control, anti-inflammatories, and good return precautions.  Patient updated and in agreement.  His vital signs and lab work is otherwise reassuring.  No signs of sepsis or peritonitis.  Final Clinical Impressions(s) / ED Diagnoses   Final diagnoses:  Abdominal pain  Epiploic appendagitis    ED Discharge Orders         Ordered    oxyCODONE-acetaminophen (PERCOCET/ROXICET) 5-325 MG tablet  Every 4 hours PRN     07/02/17 2331    ondansetron (ZOFRAN ODT) 4 MG disintegrating tablet  Every 8 hours PRN     07/02/17 2331    docusate sodium (COLACE) 250 MG capsule  Daily     07/02/17 2331    ibuprofen (ADVIL,MOTRIN) 600 MG tablet  Every 8 hours PRN     07/02/17 2339       Shaune Pollack, MD 07/03/17 0020

## 2018-11-11 IMAGING — CT CT ABD-PELV W/ CM
2 of 5 series · 16 of 46 positions shown, 18 images · IV contrast (APPLIED)
Comparison: Abdominal ultrasound July 02, 2017

CLINICAL DATA: RIGHT abdominal pain for 4 days. Nausea and
vomiting.

EXAM:
CT ABDOMEN AND PELVIS WITH CONTRAST
TECHNIQUE: Multidetector CT imaging of the abdomen and pelvis was performed
using the standard protocol following bolus administration of
intravenous contrast.
CONTRAST:  100mL FWFJJJ-NII IOPAMIDOL (FWFJJJ-NII) INJECTION 61%

[Series 3: abdomen 5.0 · axial · 0.88mm/px · z∈[-552,-128]mm · 13 of 99 slices shown, 15 images]
[im 7/99  soft-tissue]
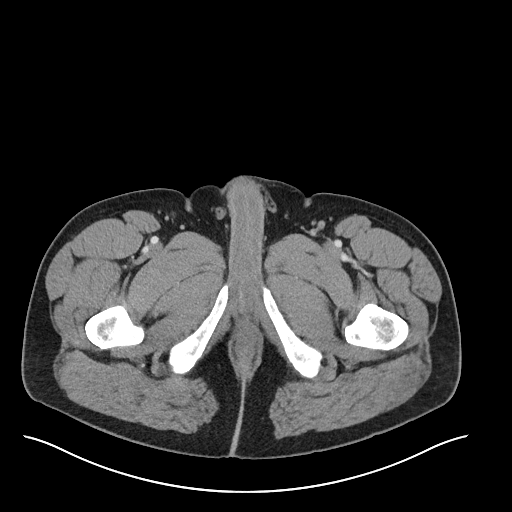
[im 7/99  bone]
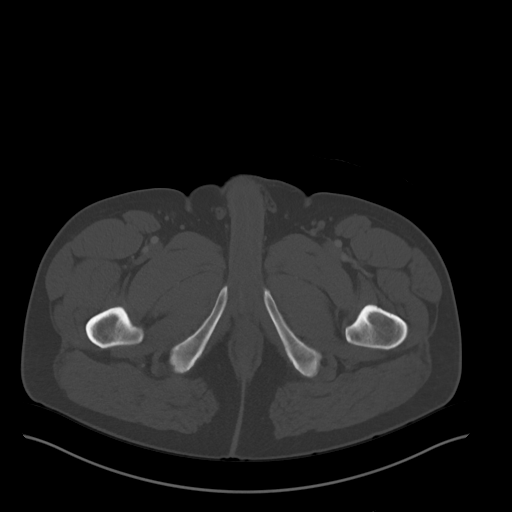
[im 13/99  soft-tissue]
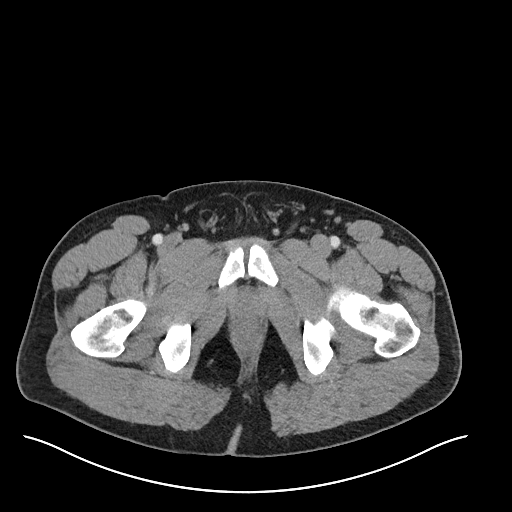
[im 19/99  soft-tissue]
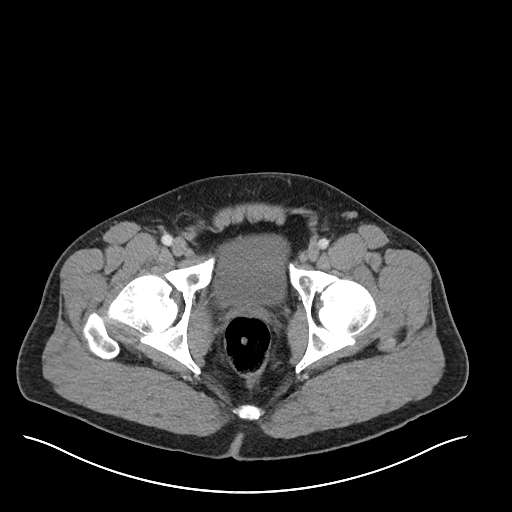
[im 31/99  soft-tissue]
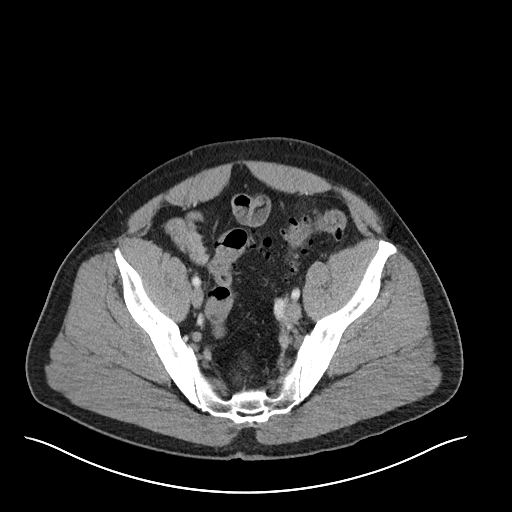
[im 37/99  soft-tissue]
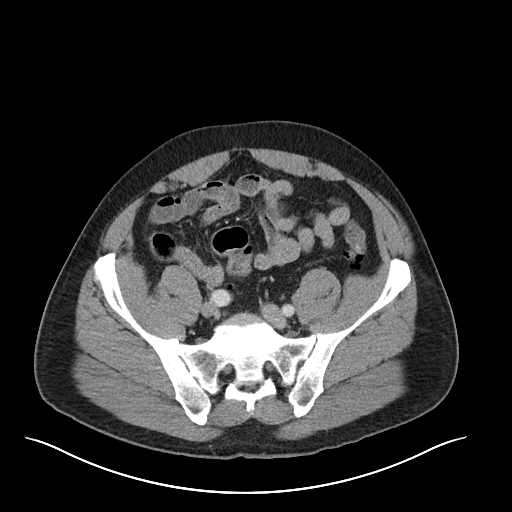
[im 43/99  soft-tissue]
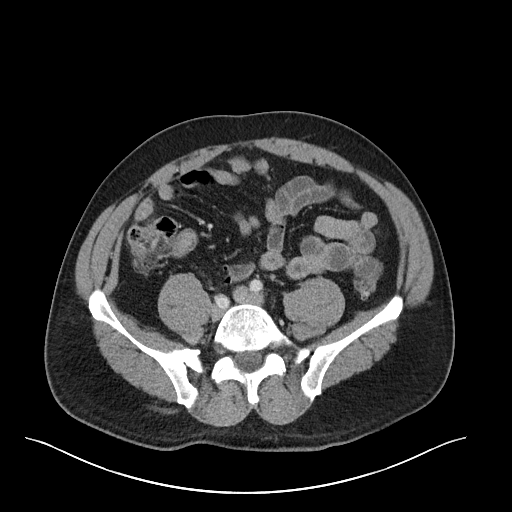
[im 50/99  soft-tissue]
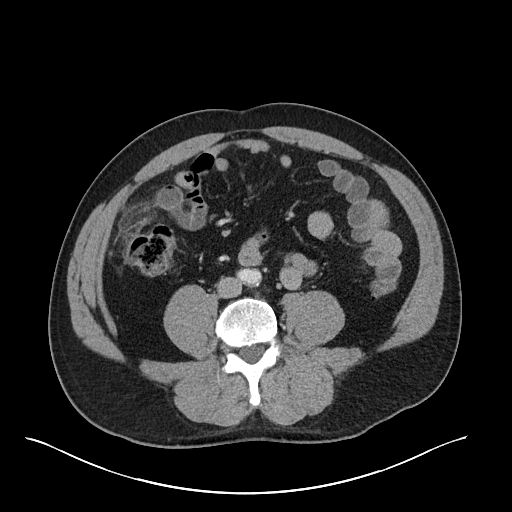
[im 56/99  soft-tissue]
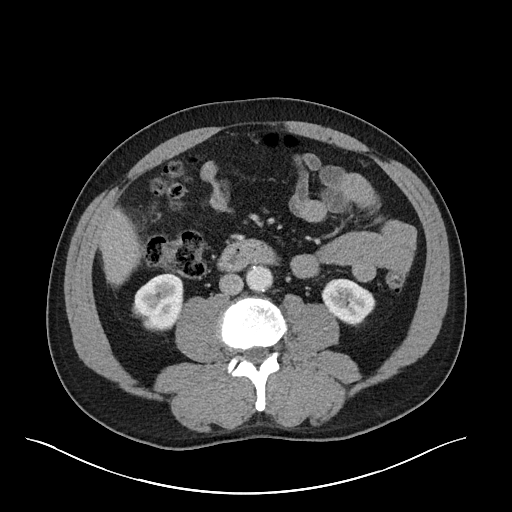
[im 62/99  soft-tissue]
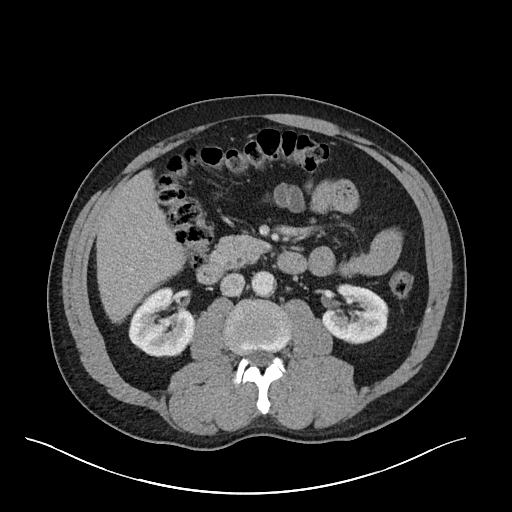
[im 62/99  bone]
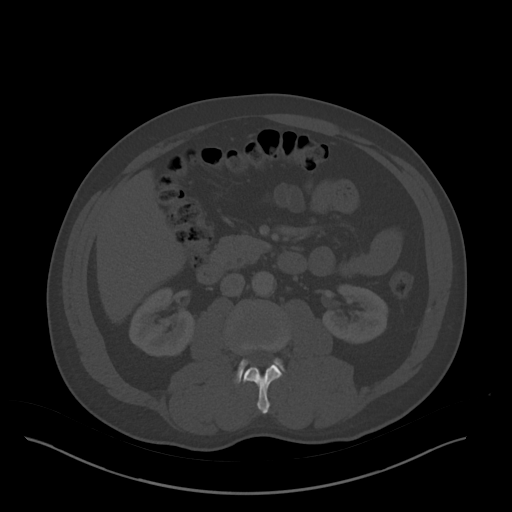
[im 68/99  soft-tissue]
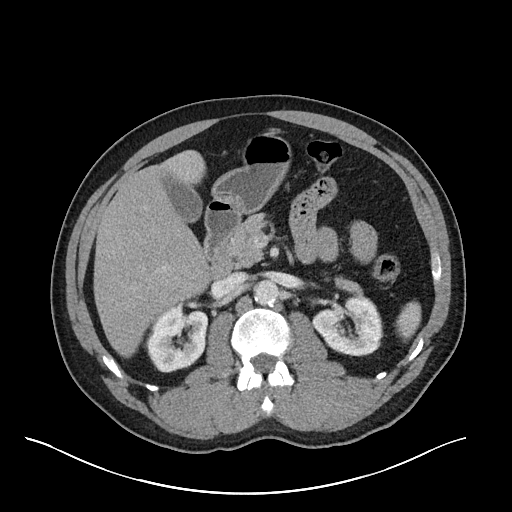
[im 80/99  soft-tissue]
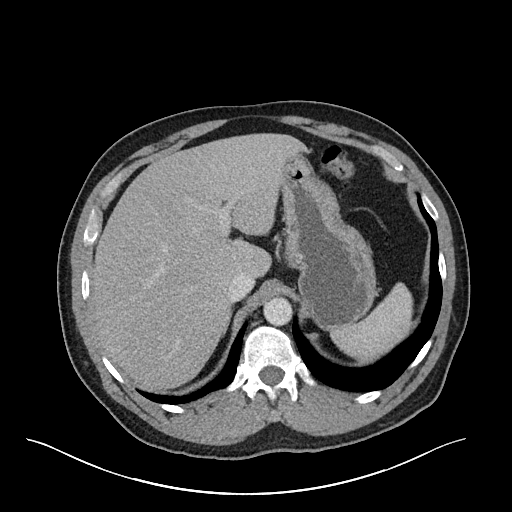
[im 86/99  soft-tissue]
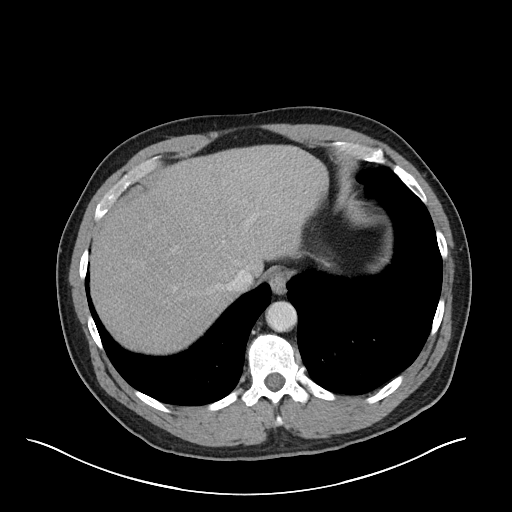
[im 92/99  soft-tissue]
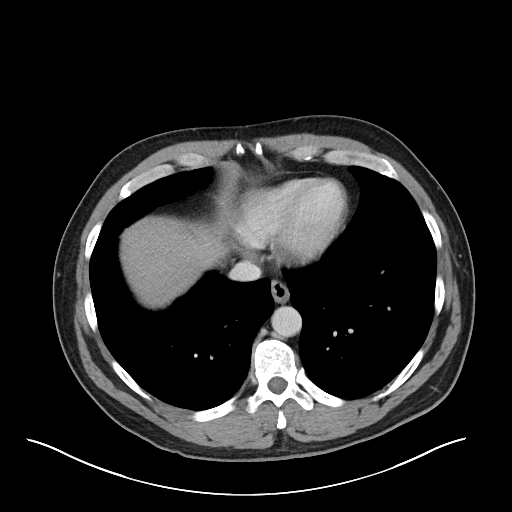

[Series 6: abdomen 3.0 mpr cor · coronal · 0.88mm/px · 3 of 98 slices shown]
[im 33/98  soft-tissue]
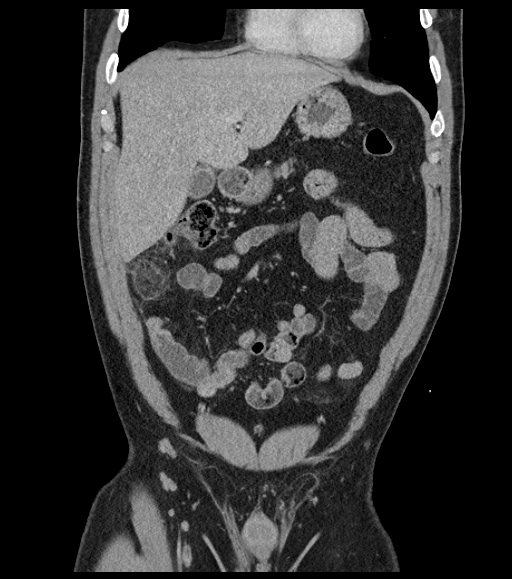
[im 44/98  soft-tissue]
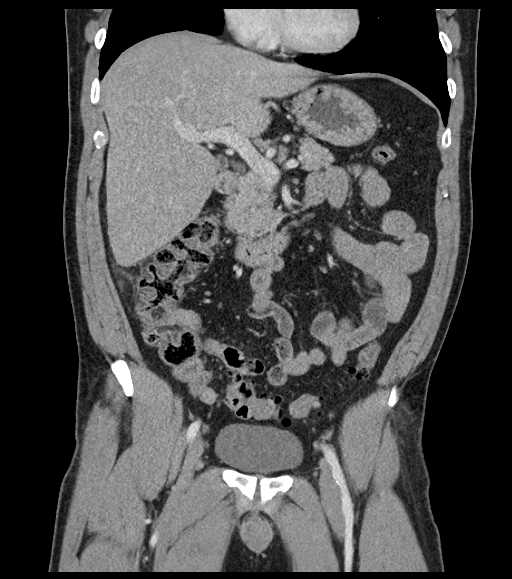
[im 54/98  soft-tissue]
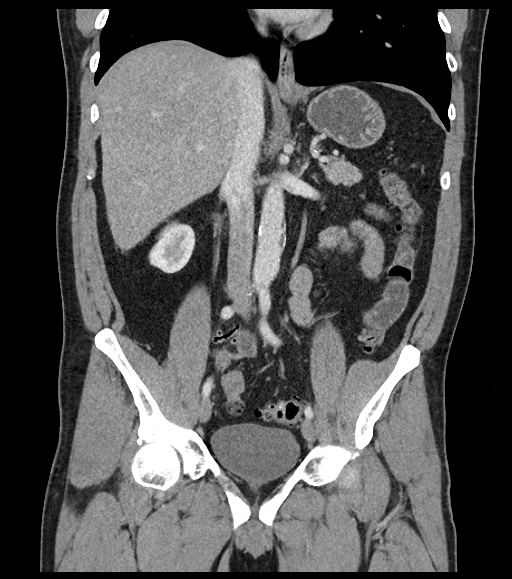

[16 of 46 positions shown; findings below may reference images not displayed]

FINDINGS: LOWER CHEST: Dependent atelectasis. 3 mm subpleural RIGHT lower lobe
pulmonary nodule, no routine indicated follow-up by consensus
criteria. Included heart size is normal. No pericardial effusion.

HEPATOBILIARY: Liver and gallbladder are normal.

PANCREAS: Normal.

SPLEEN: Normal.

ADRENALS/URINARY TRACT: Kidneys are orthotopic, demonstrating
symmetric enhancement. No nephrolithiasis, hydronephrosis or solid
renal masses. The unopacified ureters are normal in course and
caliber. Delayed imaging through the kidneys demonstrates symmetric
prompt contrast excretion within the proximal urinary collecting
system. Urinary bladder is adequately distended and unremarkable.
Normal adrenal glands.

STOMACH/BOWEL: The stomach, small and large bowel are normal in
course and caliber without inflammatory changes, sensitivity
decreased without oral contrast. Moderate to severe sigmoid and
descending colonic diverticulosis. A few additional scattered
colonic diverticula present. Normal appendix.

VASCULAR/LYMPHATIC: Aortoiliac vessels are normal in course and
caliber. Mild intimal thickening calcific atherosclerosis. No
lymphadenopathy by CT size criteria.

REPRODUCTIVE: Normal.

OTHER: Focal inflammation/fat necrosis RIGHT abdomen displacing
bowel. No intraperitoneal free fluid or free air. Small bilateral
fat containing inguinal hernias. Small fat containing umbilical
hernia.

MUSCULOSKELETAL: Nonacute.  Mild sacroiliac osteoarthrosis.
IMPRESSION: 1. RIGHT abdominal omental infarct versus epiploic appendagitis.
2. Colonic diverticulosis without acute diverticulitis. Normal
appendix.

Aortic Atherosclerosis ([id number]).

## 2019-04-05 IMAGING — US US ABDOMEN LIMITED
1 series · 14 of 25 positions shown · non-contrast
Comparison: None.

CLINICAL DATA: Right upper quadrant pain since [REDACTED].

EXAM:
ULTRASOUND ABDOMEN LIMITED RIGHT UPPER QUADRANT

[Series 1: us abdomen limited · 0.23mm/px · 14 of 30 slices shown]
[im 1/30]
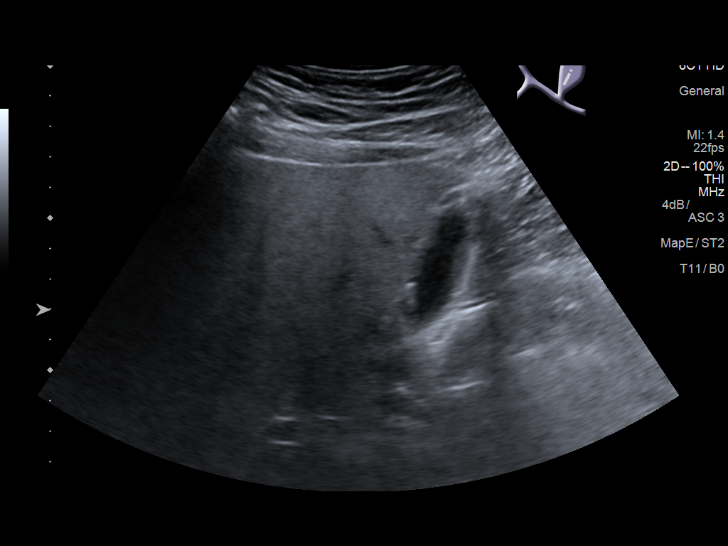
[im 3/30]
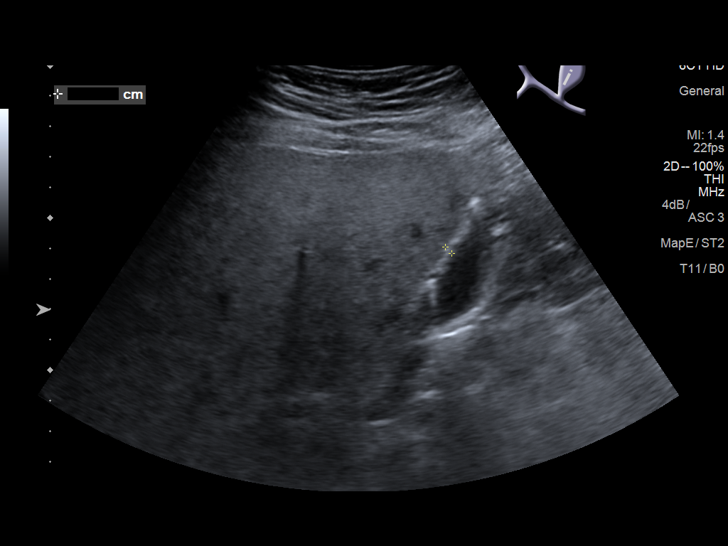
[im 5/30]
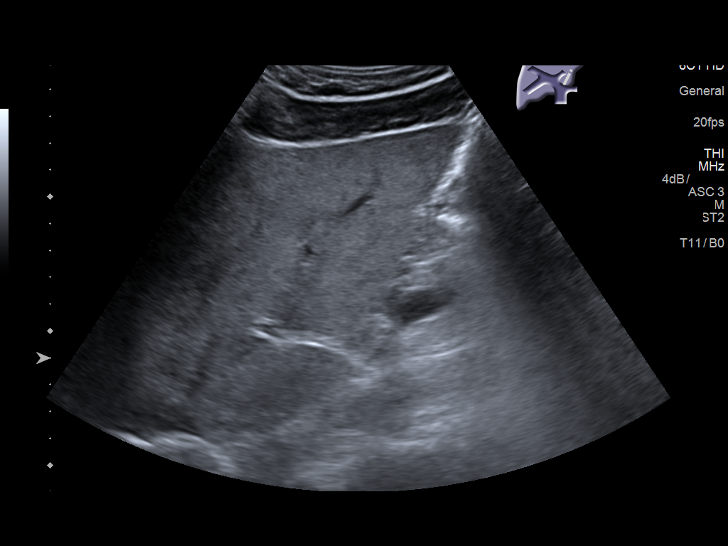
[im 8/30]
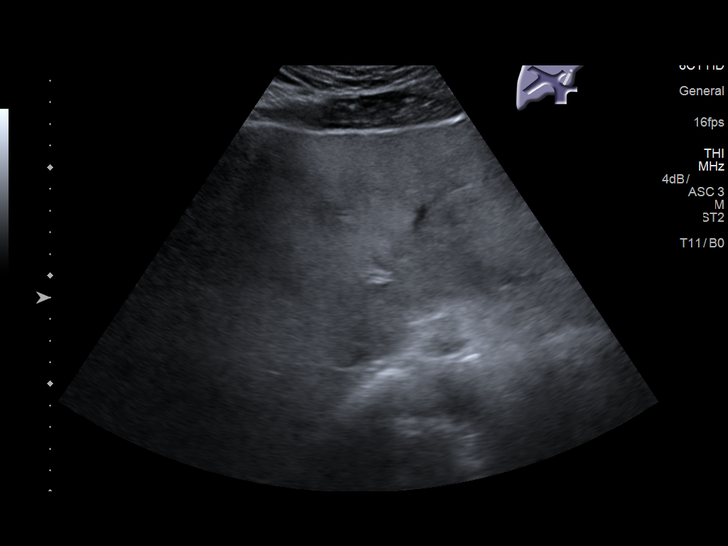
[im 10/30]
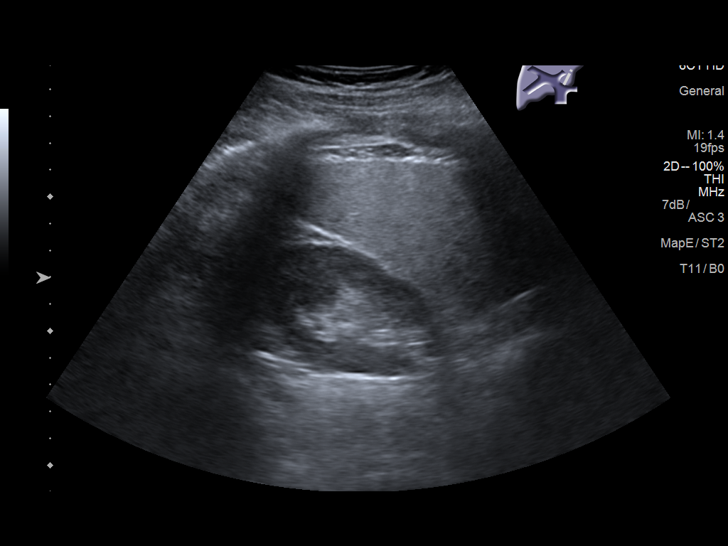
[im 11/30]
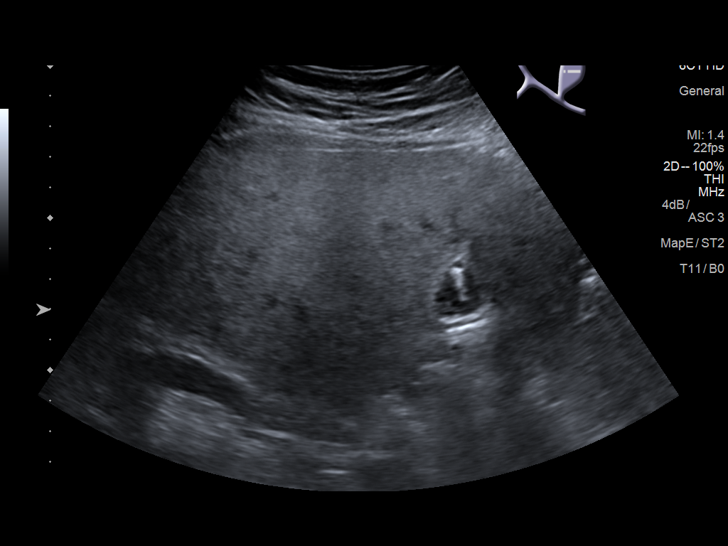
[im 14/30]
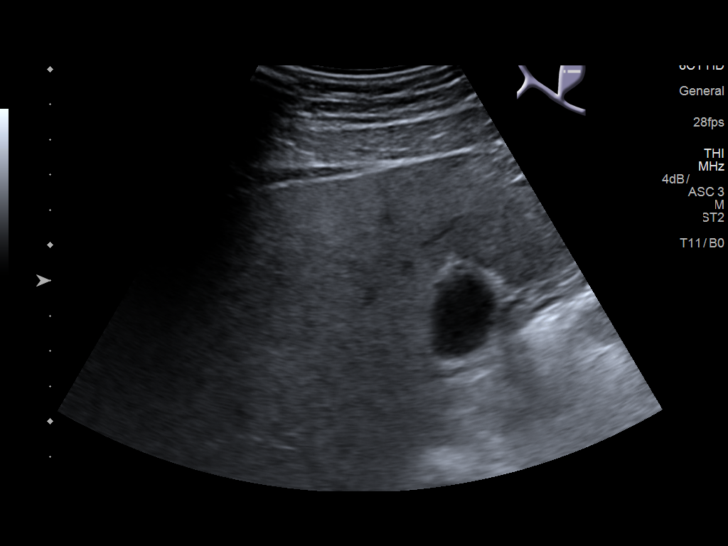
[im 16/30]
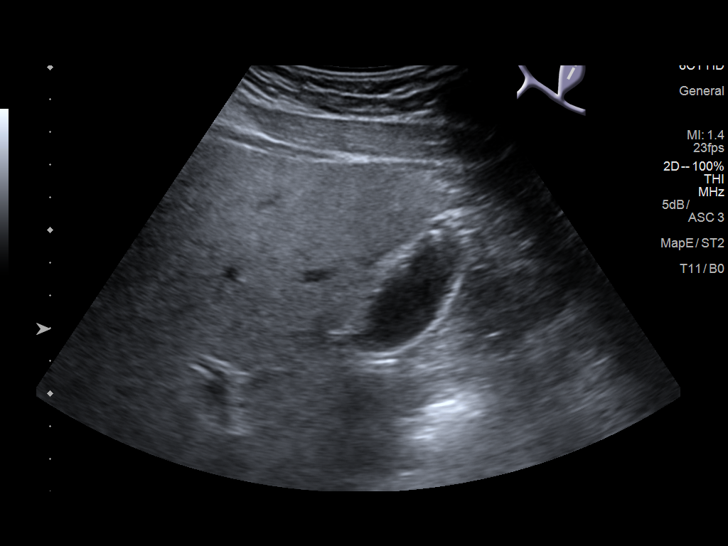
[im 19/30]
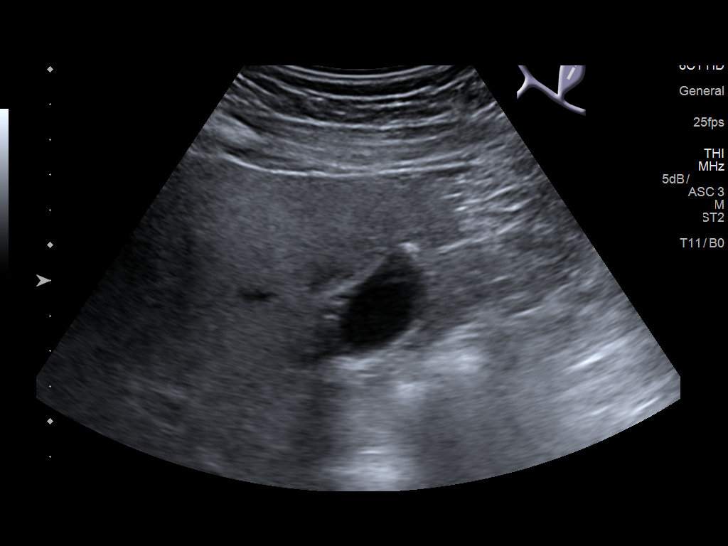
[im 20/30]
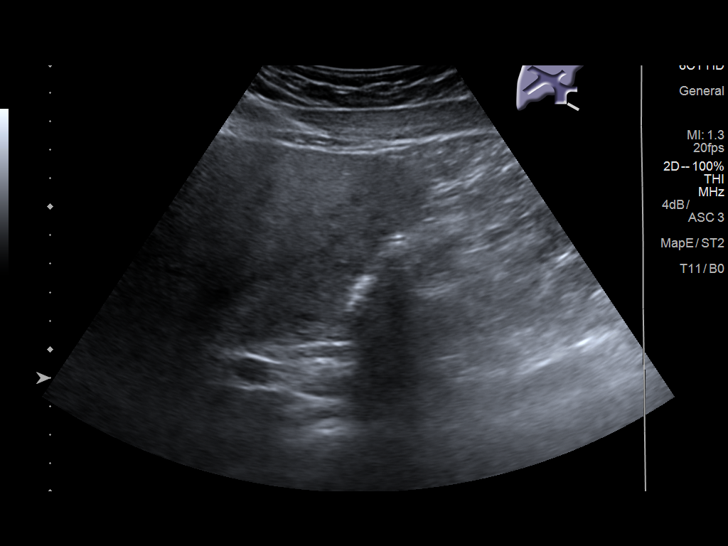
[im 22/30]
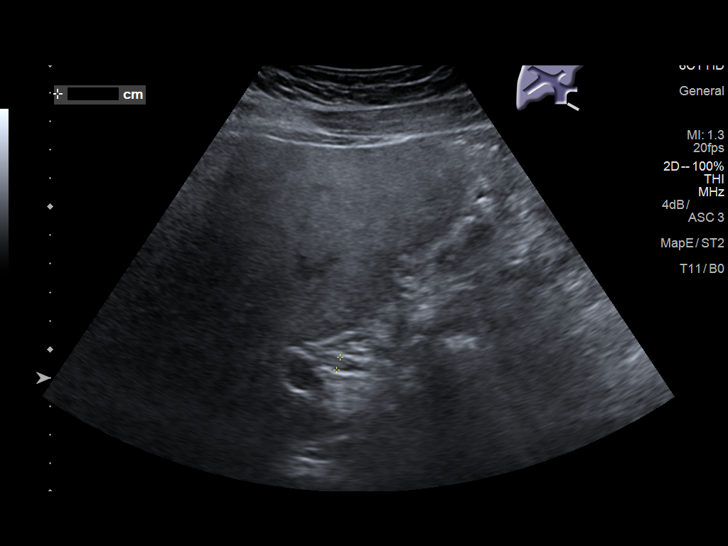
[im 25/30]
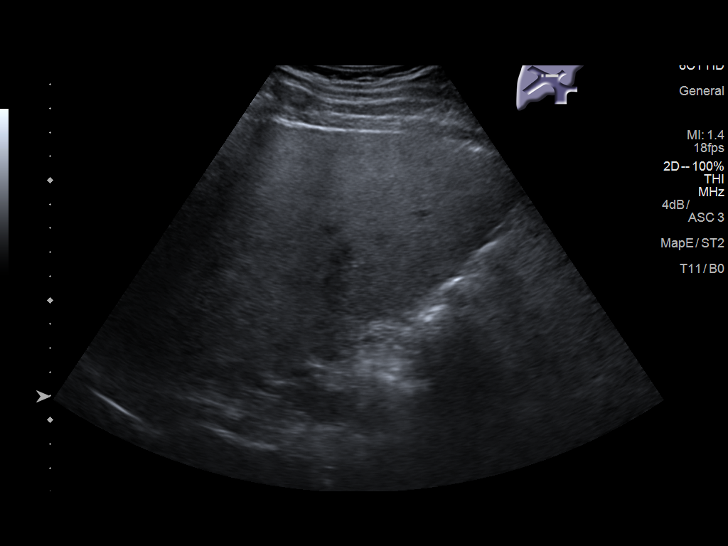
[im 27/30]
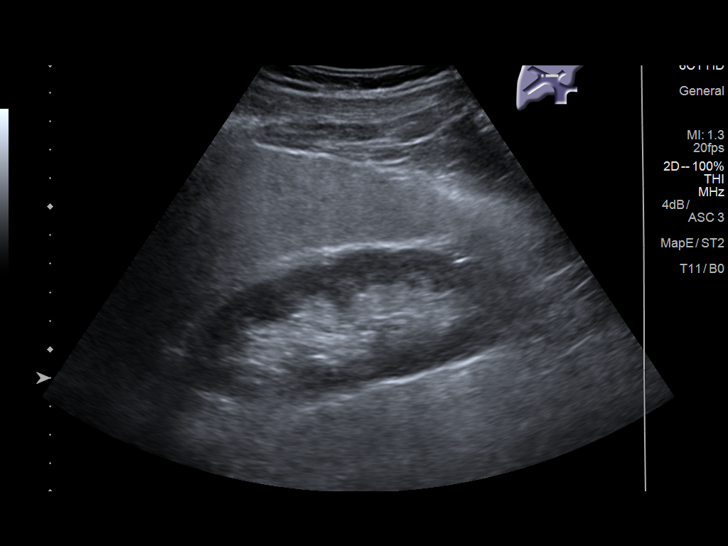
[im 30/30]
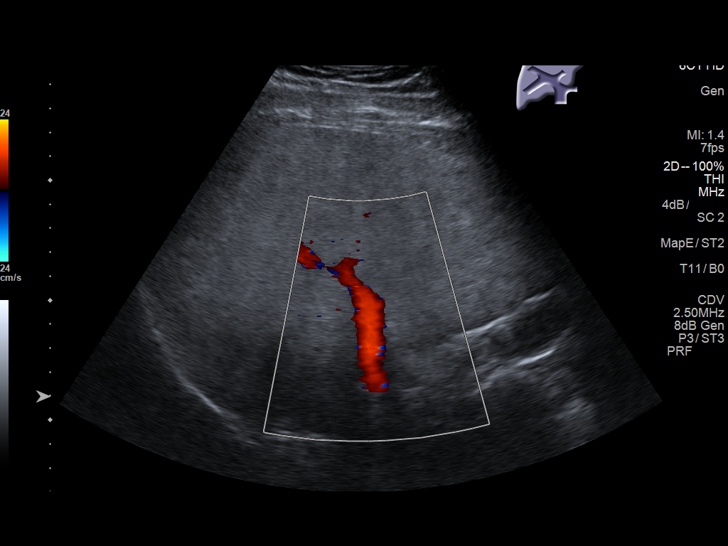

[14 of 25 positions shown; findings below may reference images not displayed]

FINDINGS: Gallbladder:

Comet tail artifact from the gallbladder fundus is suspected on
image 16. No stone, wall thickening, pericholecystic fluid.
Sonographic Murphy's sign was not elicited.

Common bile duct:

Diameter: Normal, 5 mm.

Liver:

Heterogeneously increased in echogenicity. Portal vein is patent on
color Doppler imaging with normal direction of blood flow towards
the liver.
IMPRESSION: 1.  No acute process or explanation for right upper quadrant pain.
2. Suspect gallbladder adenomyomatosis.
3. Heterogeneous hepatic steatosis.

## 2020-07-05 ENCOUNTER — Other Ambulatory Visit: Payer: Self-pay

## 2020-07-05 ENCOUNTER — Emergency Department (HOSPITAL_COMMUNITY)
Admission: EM | Admit: 2020-07-05 | Discharge: 2020-07-05 | Disposition: A | Discharge status: Home / Self Care | Payer: Self-pay | Attending: Emergency Medicine | Admitting: Emergency Medicine

## 2020-07-05 ENCOUNTER — Encounter (HOSPITAL_COMMUNITY): Payer: Self-pay

## 2020-07-05 ENCOUNTER — Emergency Department (HOSPITAL_COMMUNITY): Payer: Self-pay

## 2020-07-05 DIAGNOSIS — X58XXXA Exposure to other specified factors, initial encounter: Secondary | ICD-10-CM | POA: Insufficient documentation

## 2020-07-05 DIAGNOSIS — Y990 Civilian activity done for income or pay: Secondary | ICD-10-CM | POA: Insufficient documentation

## 2020-07-05 DIAGNOSIS — F17210 Nicotine dependence, cigarettes, uncomplicated: Secondary | ICD-10-CM | POA: Insufficient documentation

## 2020-07-05 DIAGNOSIS — S66911A Strain of unspecified muscle, fascia and tendon at wrist and hand level, right hand, initial encounter: Secondary | ICD-10-CM | POA: Insufficient documentation

## 2020-07-05 MED ORDER — DICLOFENAC SODIUM 50 MG PO TBEC: 50.0000 mg | DELAYED_RELEASE_TABLET | Freq: Two times a day (BID) | ORAL | 0 refills | Status: AC

## 2020-07-05 NOTE — ED Provider Notes (Signed)
[facility]-EMERGENCY DEPT Provider Note   CSN: [id number] Arrival date & time: 07/05/20  4098     History No chief complaint on file.   LEMONTE AL is a 55 y.o. male.  55 year old male presents with complaint of right wrist pain x3 months, onset without injury.  Patient is right-hand dominant, works as a Curator, reports pain around the wrist that is aching in nature, limits his ability to work and use this wrist, worse with movement of the wrist or bearing weight/push up off of the wrist/hand.  Denies redness, swelling.  Patient has tried Advil and Aleve without any relief.  No other complaints or concerns.        Past Medical History:  Diagnosis Date  . Shoulder dislocation     There are no problems to display for this patient.   History reviewed. No pertinent surgical history.     No family history on file.  Social History   Tobacco Use  . Smoking status: Current Every Day Smoker    Packs/day: 1.50    Types: Cigarettes  . Smokeless tobacco: Never Used  Substance Use Topics  . Alcohol use: Yes    Comment: beer occ  . Drug use: No    Home Medications Prior to Admission medications   Medication Sig Start Date End Date Taking? Authorizing Provider  diclofenac (VOLTAREN) 50 MG EC tablet Take 1 tablet (50 mg total) by mouth 2 (two) times daily for 10 days. 07/05/20 07/15/20 Yes Jeannie Fend, PA-C    Allergies    Patient has no known allergies.  Review of Systems   Review of Systems  Constitutional: Negative for fever.  Musculoskeletal: Positive for arthralgias. Negative for joint swelling.  Skin: Negative for color change, rash and wound.  Allergic/Immunologic: Negative for immunocompromised state.  Neurological: Negative for weakness and numbness.    Physical Exam Updated Vital Signs BP (!) 137/99 (BP Location: Right Arm)   Pulse (!) 59   Temp (!) 97.5 F (36.4 C) (Oral)   Resp 20   Ht 5\' 8"  (1.727 m)   Wt 96.2 kg    SpO2 96%   BMI 32.25 kg/m   Physical Exam Vitals and nursing note reviewed.  Cardiovascular:     Pulses: Normal pulses.  Musculoskeletal:        General: Tenderness present. No swelling, deformity or signs of injury.     Right hand: Tenderness present. No swelling, deformity, lacerations or bony tenderness. Decreased range of motion. Normal strength. Normal sensation. Normal pulse.  Skin:    General: Skin is warm and dry.     Findings: No erythema or rash.  Neurological:     Mental Status: He is alert.     Sensory: No sensory deficit.  Psychiatric:        Behavior: Behavior normal.     ED Results / Procedures / Treatments   Labs (all labs ordered are listed, but only abnormal results are displayed) Labs Reviewed - No data to display  EKG None  Radiology DG Wrist Complete Right  Result Date: 07/05/2020 CLINICAL DATA:  Dorsal right wrist pain for 3 months, atraumatic EXAM: RIGHT WRIST - COMPLETE 3+ VIEW COMPARISON:  None. FINDINGS: There is no evidence of fracture or dislocation. There is no evidence of arthropathy or other focal bone abnormality. Soft tissues are unremarkable. IMPRESSION: Negative. Electronically Signed   By: 09/04/2020 M.D.   On: 07/05/2020 10:52    Procedures Procedures   Medications  Ordered in ED Medications - No data to display  ED Course  I have reviewed the triage vital signs and the nursing notes.  Pertinent labs & imaging results that were available during my care of the patient were reviewed by me and considered in my medical decision making (see chart for details).  Clinical Course as of 07/05/20 1123  Sat Jul 05, 2020  5217 55 year old male with complaint of right wrist pain for 3 months without injury.  On exam, has mild tenderness of the distal radius and ulna, described as stiffness.  No erythema, no wounds/skin intact.,  Strong radial pulse present, sensation intact to each finger. No history of prior injury to this wrist. Plan is  for x-ray, suspect arthritic process. [LM]  1122 X-ray right wrist is unremarkable.  Offered right wrist splint however patient states he has one at home and declines ER wrist splint.  Daryl Eastern is to apply warm compresses to the right wrist for 20 minutes at a time, prescribed diclofenac for his pain and referred to Ortho for follow-up. [LM]    Clinical Course User Index [LM] Alden Hipp   MDM Rules/Calculators/A&P                          Final Clinical Impression(s) / ED Diagnoses Final diagnoses:  Muscle strain of right wrist, initial encounter    Rx / DC Orders ED Discharge Orders         Ordered    diclofenac (VOLTAREN) 50 MG EC tablet  2 times daily        07/05/20 1121           Alden Hipp 07/05/20 1123    Terald Sleeper, MD 07/05/20 [phone]

## 2020-07-05 NOTE — ED Notes (Signed)
Xray at the bedside.

## 2020-07-05 NOTE — Discharge Instructions (Addendum)
Warm compress to right wrist 20 minutes at a time. Wear right wrist brace to rest your wrist for the next 10 days. Take diclofenac as prescribed, with food.  Discontinue use if you develop abdominal pain.  Follow-up with orthopedics as discussed for recheck and further treatment.

## 2020-07-05 NOTE — ED Triage Notes (Signed)
Patient presented to the ed with c/o right wrist pain for the past 3 months. Patient report pain is 10/10 with movement.
